# Patient Record
Sex: Female | Born: 1955 | Race: White | Hispanic: No | Marital: Married | Smoking: Current every day smoker
Health system: Southern US, Community
[De-identification: ages and names within clinical notes are randomized; demographics above are authoritative.]

## PROBLEM LIST (undated history)

## (undated) DIAGNOSIS — Z8614 Personal history of Methicillin resistant Staphylococcus aureus infection: Secondary | ICD-10-CM

## (undated) DIAGNOSIS — E785 Hyperlipidemia, unspecified: Secondary | ICD-10-CM

## (undated) DIAGNOSIS — G5600 Carpal tunnel syndrome, unspecified upper limb: Secondary | ICD-10-CM

## (undated) DIAGNOSIS — E559 Vitamin D deficiency, unspecified: Secondary | ICD-10-CM

## (undated) DIAGNOSIS — R7989 Other specified abnormal findings of blood chemistry: Secondary | ICD-10-CM

## (undated) DIAGNOSIS — D179 Benign lipomatous neoplasm, unspecified: Secondary | ICD-10-CM

## (undated) DIAGNOSIS — I10 Essential (primary) hypertension: Secondary | ICD-10-CM

## (undated) DIAGNOSIS — F419 Anxiety disorder, unspecified: Secondary | ICD-10-CM

## (undated) DIAGNOSIS — N200 Calculus of kidney: Secondary | ICD-10-CM

## (undated) DIAGNOSIS — D759 Disease of blood and blood-forming organs, unspecified: Secondary | ICD-10-CM

## (undated) DIAGNOSIS — E039 Hypothyroidism, unspecified: Secondary | ICD-10-CM

## (undated) DIAGNOSIS — M8580 Other specified disorders of bone density and structure, unspecified site: Secondary | ICD-10-CM

## (undated) HISTORY — DX: Calculus of kidney: N20.0

## (undated) HISTORY — DX: Anxiety disorder, unspecified: F41.9

## (undated) HISTORY — DX: Hypothyroidism, unspecified: E03.9

## (undated) HISTORY — DX: Other specified abnormal findings of blood chemistry: R79.89

## (undated) HISTORY — DX: Vitamin D deficiency, unspecified: E55.9

## (undated) HISTORY — DX: Hyperlipidemia, unspecified: E78.5

## (undated) HISTORY — DX: Benign lipomatous neoplasm, unspecified: D17.9

## (undated) HISTORY — DX: Other specified disorders of bone density and structure, unspecified site: M85.80

## (undated) HISTORY — DX: Essential (primary) hypertension: I10

---

## 1997-06-22 ENCOUNTER — Other Ambulatory Visit: Admission: RE | Admit: 1997-06-22 | Discharge: 1997-06-22 | Payer: Self-pay | Admitting: Gynecology

## 1997-12-05 ENCOUNTER — Ambulatory Visit (HOSPITAL_COMMUNITY): Admission: RE | Admit: 1997-12-05 | Discharge: 1997-12-05 | Payer: Self-pay | Admitting: Gynecology

## 1999-03-22 ENCOUNTER — Other Ambulatory Visit: Admission: RE | Admit: 1999-03-22 | Discharge: 1999-03-22 | Payer: Self-pay | Admitting: Gynecology

## 2000-12-29 ENCOUNTER — Other Ambulatory Visit: Admission: RE | Admit: 2000-12-29 | Discharge: 2000-12-29 | Payer: Self-pay | Admitting: Gynecology

## 2001-02-03 ENCOUNTER — Ambulatory Visit (HOSPITAL_COMMUNITY): Admission: RE | Admit: 2001-02-03 | Discharge: 2001-02-03 | Payer: Self-pay | Admitting: Internal Medicine

## 2001-02-03 ENCOUNTER — Encounter: Payer: Self-pay | Admitting: Internal Medicine

## 2003-08-31 ENCOUNTER — Encounter (INDEPENDENT_AMBULATORY_CARE_PROVIDER_SITE_OTHER): Payer: Self-pay | Admitting: *Deleted

## 2003-08-31 ENCOUNTER — Ambulatory Visit (HOSPITAL_COMMUNITY): Admission: RE | Admit: 2003-08-31 | Discharge: 2003-08-31 | Payer: Self-pay | Admitting: Surgery

## 2003-08-31 ENCOUNTER — Ambulatory Visit (HOSPITAL_BASED_OUTPATIENT_CLINIC_OR_DEPARTMENT_OTHER): Admission: RE | Admit: 2003-08-31 | Discharge: 2003-08-31 | Payer: Self-pay | Admitting: Surgery

## 2006-04-15 ENCOUNTER — Ambulatory Visit: Payer: Self-pay | Admitting: Internal Medicine

## 2007-03-05 HISTORY — PX: TOTAL ABDOMINAL HYSTERECTOMY W/ BILATERAL SALPINGOOPHORECTOMY: SHX83

## 2008-04-22 ENCOUNTER — Ambulatory Visit: Payer: Self-pay | Admitting: Internal Medicine

## 2008-04-27 ENCOUNTER — Ambulatory Visit: Payer: Self-pay | Admitting: Internal Medicine

## 2008-05-02 ENCOUNTER — Ambulatory Visit: Payer: Self-pay | Admitting: Gynecologic Oncology

## 2008-05-17 ENCOUNTER — Ambulatory Visit: Payer: Self-pay | Admitting: Gynecologic Oncology

## 2008-05-23 ENCOUNTER — Ambulatory Visit: Payer: Self-pay | Admitting: Internal Medicine

## 2008-06-01 ENCOUNTER — Ambulatory Visit: Payer: Self-pay | Admitting: Obstetrics and Gynecology

## 2008-06-02 ENCOUNTER — Ambulatory Visit: Payer: Self-pay | Admitting: Gynecologic Oncology

## 2008-06-10 ENCOUNTER — Inpatient Hospital Stay: Payer: Self-pay | Admitting: Obstetrics and Gynecology

## 2009-03-15 ENCOUNTER — Ambulatory Visit: Payer: Self-pay

## 2009-12-25 ENCOUNTER — Ambulatory Visit: Payer: Self-pay | Admitting: Internal Medicine

## 2010-04-03 NOTE — Assessment & Plan Note (Signed)
Summary: POSSIBLE BRONCHITIS/EVM   Vital Signs:  Patient Profile:   55 Years Old Female CC:      Cold & URI symptoms Height:     61.25 inches Weight:      158 pounds BMI:     29.72 O2 Sat:      97 % O2 treatment:    Room Air Temp:     98.4 degrees F oral Pulse rate:   104 / minute Pulse rhythm:   regular Resp:     20 per minute BP sitting:   163 / 101  (right arm)  Pt. in pain?   no  Vitals Entered By: Levonne Spiller EMT-P (December 25, 2009 4:43 PM)              Is Patient Diabetic? No Comments Pt is a smoker.1 pack per day.      Current Allergies: No known allergies History of Present Illness Chief Complaint: Cold & URI symptoms  REVIEW OF SYSTEMS Constitutional Symptoms      Denies fever, chills, night sweats, weight loss, weight gain, and fatigue.  Eyes       Denies change in vision, eye pain, eye discharge, glasses, contact lenses, and eye surgery. Ear/Nose/Throat/Mouth       Complains of sore throat and hoarseness.      Denies hearing loss/aids, change in hearing, ear pain, ear discharge, dizziness, frequent runny nose, frequent nose bleeds, sinus problems, and tooth pain or bleeding.  Respiratory       Complains of productive cough, wheezing, shortness of breath, and bronchitis.      Denies dry cough, asthma, and emphysema/COPD.      Comments: Colored Productive cough Cardiovascular       Denies murmurs, chest pain, and tires easily with exhertion.    Gastrointestinal       Denies stomach pain, nausea/vomiting, diarrhea, constipation, blood in bowel movements, and indigestion. Genitourniary       Denies painful urination, kidney stones, and loss of urinary control. Neurological       Denies paralysis, seizures, and fainting/blackouts. Musculoskeletal       Denies muscle pain, joint pain, joint stiffness, decreased range of motion, redness, swelling, muscle weakness, and gout.  Skin       Denies bruising, unusual mles/lumps or sores, and hair/skin or nail  changes.  Psych       Denies mood changes, temper/anger issues, anxiety/stress, speech problems, depression, and sleep problems.  Plan New Medications/Changes: HYCODAN 5 cc by mouth q 6 h as needed cough  #4 oz x 0, 12/25/2009, J. Juline Patch MD   The patient and/or caregiver has been counseled thoroughly with regard to medications prescribed including dosage, schedule, interactions, rationale for use, and possible side effects and they verbalize understanding.  Diagnoses and expected course of recovery discussed and will return if not improved as expected or if the condition worsens. Patient and/or caregiver verbalized understanding.  Prescriptions: HYCODAN 5 cc by mouth q 6 h as needed cough  #4 oz x 0   Entered and Authorized by:   J. Juline Patch MD   Signed by:   Shela Commons. Juline Patch MD on 12/25/2009   Method used:   Print then Give to Patient   RxID:   [id number]   Appended Document: POSSIBLE BRONCHITIS/EVM This office visit is done as an append so that the patient and staff didn't have to wait for a narcotic prescription to be printed.  cc: cough for 2-3 weeks that got  worse in last 24 hrs. Is yellow productive, mild to paroxysmal, and associated with wheezing, DOE,  patient denies chest pain, fever/chills, sob at rest. patient tried corriciden for hypertensives and it hasn't helped. patient smokes 1 ppd but none in last 3 days. tried an old zyban rx which was prescribed before starting paxil. reports everyone at her office with similar and those seeing doctors have reponded to antibiotics. on ros: slept OK and no change in activity level.  physical exam.  wf nad ox4 no cough during exam. eyes without redness. tms clear nasal clear throat nl. no pnd nodes. none a/c chest full excursion with mild wheezing left, mod on right, worse at bases posteriorly. no dullness. cor rrr no murmur extrem. no cynaosis clubbing edema neuro, cn intact, moves extreme well, nl  gait  dx. asthmatic bronchitis  plan: z pak, ventolin mdh 2 puffs q 4-8 as needed, hycodan syrup 5 cc q 6h as needed cough, reduce dose/freq of corriciden, stop zyban and go to patches until it can be discussed with pcp. continue to abstain from smoking. blood pressure check here tomorrow. fluids

## 2010-07-20 NOTE — Op Note (Signed)
NAME:  Mariah Flores, Mariah Flores                          ACCOUNT NO.:  [id number]   MEDICAL RECORD NO.:  [id number]                   PATIENT TYPE:  AMB   LOCATION:  [facility]                                  FACILITY:  [facility]   PHYSICIAN:  Currie Paris, M.D.           DATE OF BIRTH:  08-25-1955   DATE OF PROCEDURE:  08/31/2003  DATE OF DISCHARGE:                                 OPERATIVE REPORT   [facility] [phone].   PREOPERATIVE DIAGNOSIS:  Left supraclavicular mass.   POSTOPERATIVE DIAGNOSIS:  Left supraclavicular mass, lipoma.   OPERATION:  Excision of left supraclavicular mass.   SURGEON:  Currie Paris, M.D.   ANESTHESIA:  MAC.   CLINICAL HISTORY:  This patient is a 55 year old with a soft but persistent  left supraclavicular mass.  I was concerned this might represent some  adenopathy and after discussion with the patient, we elected to excise it.   DESCRIPTION OF PROCEDURE:  The patient was seen in the holding area, had no  further questions.  The mass was marked by palpation and was readily  palpable in the left supraclavicular area, about a fingerbreadth and a half  above the clavicular edge.   She was taken to the operating room and given IV sedation and was prepped  and draped.  I anesthetized with a combination of 1% Xylocaine plain and  0.5% Marcaine mixed equally.  I made a short incision directly over the mass  and it proved to be a well-circumscribed lipoma, which was sharply dissected  out and excised.  There was no other palpable mass underneath this or around  the area.   Everything appeared to be dry, so the incision was closed with a single 3-0  Vicryl subcutaneously, followed by 4-0 Monocryl subcuticular and Dermabond.   The patient tolerated the procedure well.  There were no complications, and  all counts were correct.                                               Currie Paris, M.D.   CJS/MEDQ  D:  08/31/2003  T:  08/31/2003  Job:  [id number]   cc:    Gabriel Earing, M.D.  [address]  [place]  [place] [address]  Fax: [phone]

## 2010-10-23 ENCOUNTER — Ambulatory Visit: Payer: Self-pay | Admitting: Obstetrics and Gynecology

## 2011-02-20 ENCOUNTER — Ambulatory Visit: Payer: Self-pay | Admitting: Women's Health

## 2015-02-20 ENCOUNTER — Other Ambulatory Visit: Payer: Self-pay | Admitting: Internal Medicine

## 2015-02-20 ENCOUNTER — Inpatient Hospital Stay: Payer: 59 | Attending: Internal Medicine | Admitting: Internal Medicine

## 2015-02-20 ENCOUNTER — Encounter: Payer: Self-pay | Admitting: *Deleted

## 2015-02-20 ENCOUNTER — Other Ambulatory Visit: Payer: Self-pay | Admitting: *Deleted

## 2015-02-20 ENCOUNTER — Inpatient Hospital Stay: Payer: 59

## 2015-02-20 VITALS — BP 165/101 | HR 85 | Temp 98.1°F | Resp 18 | Ht 63.0 in | Wt 156.5 lb

## 2015-02-20 DIAGNOSIS — F17210 Nicotine dependence, cigarettes, uncomplicated: Secondary | ICD-10-CM

## 2015-02-20 DIAGNOSIS — Z79899 Other long term (current) drug therapy: Secondary | ICD-10-CM | POA: Diagnosis not present

## 2015-02-20 DIAGNOSIS — E785 Hyperlipidemia, unspecified: Secondary | ICD-10-CM | POA: Diagnosis not present

## 2015-02-20 DIAGNOSIS — D75839 Thrombocytosis, unspecified: Secondary | ICD-10-CM

## 2015-02-20 DIAGNOSIS — D473 Essential (hemorrhagic) thrombocythemia: Secondary | ICD-10-CM

## 2015-02-20 DIAGNOSIS — E559 Vitamin D deficiency, unspecified: Secondary | ICD-10-CM | POA: Diagnosis not present

## 2015-02-20 DIAGNOSIS — E039 Hypothyroidism, unspecified: Secondary | ICD-10-CM | POA: Insufficient documentation

## 2015-02-20 DIAGNOSIS — M8580 Other specified disorders of bone density and structure, unspecified site: Secondary | ICD-10-CM | POA: Diagnosis not present

## 2015-02-20 DIAGNOSIS — Z87442 Personal history of urinary calculi: Secondary | ICD-10-CM | POA: Insufficient documentation

## 2015-02-20 LAB — CBC WITH DIFFERENTIAL/PLATELET
BASOS PCT: 1 %
Basophils Absolute: 0.1 10*3/uL (ref 0–0.1)
Eosinophils Absolute: 0.4 10*3/uL (ref 0–0.7)
Eosinophils Relative: 5 %
HEMATOCRIT: 40.5 % (ref 35.0–47.0)
HEMOGLOBIN: 13.7 g/dL (ref 12.0–16.0)
LYMPHS ABS: 1.9 10*3/uL (ref 1.0–3.6)
Lymphocytes Relative: 25 %
MCH: 27.2 pg (ref 26.0–34.0)
MCHC: 33.8 g/dL (ref 32.0–36.0)
MCV: 80.4 fL (ref 80.0–100.0)
MONOS PCT: 7 %
Monocytes Absolute: 0.6 10*3/uL (ref 0.2–0.9)
NEUTROS ABS: 5 10*3/uL (ref 1.4–6.5)
NEUTROS PCT: 62 %
Platelets: 479 10*3/uL — ABNORMAL HIGH (ref 150–440)
RBC: 5.04 MIL/uL (ref 3.80–5.20)
RDW: 14.7 % — ABNORMAL HIGH (ref 11.5–14.5)
WBC: 7.9 10*3/uL (ref 3.6–11.0)

## 2015-02-20 LAB — IRON AND TIBC
Iron: 35 ug/dL (ref 28–170)
SATURATION RATIOS: 10 % — AB (ref 10.4–31.8)
TIBC: 355 ug/dL (ref 250–450)
UIBC: 321 ug/dL

## 2015-02-20 NOTE — Progress Notes (Signed)
[facility] CONSULT NOTE  Patient Care Team: No Pcp Per Patient as PCP - General (General Practice)  CHIEF COMPLAINTS/PURPOSE OF CONSULTATION: Thrombocytosis  # HEMATOLOGIC HISTORY:  # 2016 FEB- THROMBOCYTOSIS- [id number]; N-Hb/white count  # Smoking-   HISTORY OF PRESENTING ILLNESS:  Mariah Flores 59 y.o.  female has been referred to [place] for elevated platelet counts.  Patient was first noted to have elevated platelet counts around  470 in the February 2016;  In March 2016- 522;  Repeat labs in October 2016 498 [ normal 380];  CBC otherwise within normal limits/ CMP within normal limits.    Patient denies any unusual weight loss denies any loss of appetite night sweats or nausea vomiting. No chills.  Denies any burning pain in fingertips or toes.   no chest pain no cough.  No unusual headaches.  ROS: A complete 10 point review of system is done which is negative except mentioned above in history of present illness  MEDICAL HISTORY:  Past Medical History  Diagnosis Date  . Elevated platelet count ([place])   . Hyperlipidemia   . Hypothyroidism   . Osteopenia   . Vitamin D deficiency   . Lipoma   . Kidney stones     15 yrs ago    SURGICAL HISTORY: Past Surgical History  Procedure Laterality Date  . Total abdominal hysterectomy w/ bilateral salpingoophorectomy  2009    SOCIAL HISTORY: Social History   Social History  . Marital Status: Single    Spouse Name: N/A  . Number of Children: N/A  . Years of Education: N/A   Occupational History  . Not on file.   Social History Main Topics  . Smoking status: Current Every Day Smoker -- 1.00 packs/day for 40 years    Types: Cigarettes  . Smokeless tobacco: Never Used  . Alcohol Use: No  . Drug Use: No  . Sexual Activity: Not on file   Other Topics Concern  . Not on file   Social History Narrative  . No narrative on file    FAMILY HISTORY: Family History  Problem Relation Age of Onset  . Kidney cancer  Father 60  . Heart disease Father   . Heart disease Mother     ALLERGIES:  has No Known Allergies.  MEDICATIONS:  Current Outpatient Prescriptions  Medication Sig Dispense Refill  . benzonatate (TESSALON) 200 MG capsule Take 1 capsule by mouth 2 (two) times daily as needed. cough    . hydrochlorothiazide (HYDRODIURIL) 25 MG tablet Take 1 tablet by mouth daily.    Marland Kitchen ibandronate (BONIVA) 150 MG tablet Take 1 tablet by mouth daily.    Marland Kitchen levothyroxine (SYNTHROID, LEVOTHROID) 88 MCG tablet Take 1 tablet by mouth daily.    Marland Kitchen PARoxetine (PAXIL) 40 MG tablet Take 1 tablet by mouth daily.    . simvastatin (ZOCOR) 20 MG tablet Take 1 tablet by mouth daily.    Marland Kitchen telmisartan (MICARDIS) 40 MG tablet Take 1 tablet by mouth daily.    . Vitamin D, Ergocalciferol, (DRISDOL) 50000 UNITS CAPS capsule Take 1 capsule by mouth once a week.     No current facility-administered medications for this visit.      Marland Kitchen  PHYSICAL EXAMINATION: ECOG PERFORMANCE STATUS: 0 - Asymptomatic  Filed Vitals:   02/20/15 1133  BP: 165/101  Pulse: 85  Temp: 98.1 F (36.7 C)  Resp: 18   Filed Weights   02/20/15 1121  Weight: 156 lb 8.4 oz (71 kg)  GENERAL: Well-nourished well-developed; Alert, no distress and comfortable.   Alone. EYES: no pallor or icterus OROPHARYNX: no thrush or ulceration; good dentition  NECK: supple, no masses felt LYMPH:  no palpable lymphadenopathy in the cervical, axillary or inguinal regions LUNGS: clear to auscultation and  No wheeze or crackles HEART/CVS: regular rate & rhythm and no murmurs; No lower extremity edema ABDOMEN: abdomen soft, non-tender and normal bowel sounds Musculoskeletal:no cyanosis of digits and no clubbing  PSYCH: alert & oriented x 3 with fluent speech NEURO: no focal motor/sensory deficits SKIN:  no rashes or significant lesions  LABORATORY DATA:  I have reviewed the data as listed No results found for: WBC, HGB, HCT, MCV, PLT No results for input(s):  NA, K, CL, CO2, GLUCOSE, BUN, CREATININE, CALCIUM, GFRNONAA, GFRAA, PROT, ALBUMIN, AST, ALT, ALKPHOS, BILITOT, BILIDIR, IBILI in the last 8760 hours.  RADIOGRAPHIC STUDIES: I have personally reviewed the radiological images as listed and agreed with the findings in the report. No results found.  ASSESSMENT & PLAN:   # Thrombocytosis- platelet count 490-522 [since February 2016]; normal white count and hemoglobin. Incidental. The etiology is unclear- today to rule out myeloproliferative neoplasm like essential thrombocytosis  Versus secondary causes. check jack 2/ also check BCR ABL to rule out CML. Check iron studies.   #  Smoker-  Will counsel regarding quitting smoking/ CT screening at next visit.   the above plan of care was discussed with the patient in detail. All questions were answered.  Patient follow-up with me in approximately 3-4 weeks with above lab work.  Thank you Dr.Knowles for allowing me to participate in the care of your pleasant patient. Please do not hesitate to contact me with questions or concerns in the interim.    Cammie Sickle, MD 02/20/2015 11:54 AM

## 2015-02-28 LAB — JAK2 GENOTYPR

## 2015-02-28 LAB — BCR-ABL1 FISH
Cells Analyzed: 200
Cells Counted: 200

## 2015-02-28 LAB — MPL MUTATION ANALYSIS

## 2015-03-08 ENCOUNTER — Encounter: Payer: Self-pay | Admitting: Surgery

## 2015-03-08 ENCOUNTER — Ambulatory Visit (INDEPENDENT_AMBULATORY_CARE_PROVIDER_SITE_OTHER): Payer: 59 | Admitting: Surgery

## 2015-03-08 ENCOUNTER — Ambulatory Visit
Admission: RE | Admit: 2015-03-08 | Discharge: 2015-03-08 | Disposition: A | Discharge status: Home / Self Care | Payer: 59 | Source: Ambulatory Visit | Attending: Surgery | Admitting: Surgery

## 2015-03-08 VITALS — BP 164/97 | HR 96 | Temp 98.3°F | Ht 63.0 in | Wt 155.8 lb

## 2015-03-08 DIAGNOSIS — E039 Hypothyroidism, unspecified: Secondary | ICD-10-CM | POA: Insufficient documentation

## 2015-03-08 DIAGNOSIS — L720 Epidermal cyst: Secondary | ICD-10-CM | POA: Insufficient documentation

## 2015-03-08 DIAGNOSIS — D179 Benign lipomatous neoplasm, unspecified: Secondary | ICD-10-CM | POA: Diagnosis not present

## 2015-03-08 DIAGNOSIS — R9431 Abnormal electrocardiogram [ECG] [EKG]: Secondary | ICD-10-CM | POA: Diagnosis not present

## 2015-03-08 DIAGNOSIS — M8580 Other specified disorders of bone density and structure, unspecified site: Secondary | ICD-10-CM | POA: Insufficient documentation

## 2015-03-08 DIAGNOSIS — I10 Essential (primary) hypertension: Secondary | ICD-10-CM | POA: Insufficient documentation

## 2015-03-08 NOTE — Patient Instructions (Addendum)
We will plan on taking these two cysts off in the Operating Room on 03/20/15.  Please hold your Aspirin for 7 days prior to your procedure. (03/12/14-03/20/15)  Please refer to your pre-care (blue) information sheet.

## 2015-03-08 NOTE — Progress Notes (Signed)
Subjective:     Patient ID: Mariah Flores, female   DOB: [birth year], 60 y.o.   MRN: [id number]  HPI 60 year old female who had previously lipoma removed from left supraclavicular area and now has return.  Patient states she started noticing it coming back about 2 years ago, initial operation was in 2005, lipoma.  Patient also says she has had a similar place under her chin.  She denies any pain, redness or drainage in the area.  She denies any fever, chills, sob, chest pain, abdominal pain or dysuria.   Past Medical History  Diagnosis Date  . Elevated platelet count ([place])   . Hyperlipidemia   . Hypothyroidism   . Osteopenia   . Vitamin D deficiency   . Lipoma   . Kidney stones     15 yrs ago  . Hypertension   . Anxiety    Past Surgical History  Procedure Laterality Date  . Total abdominal hysterectomy w/ bilateral salpingoophorectomy  2009   Family History  Problem Relation Age of Onset  . Kidney cancer Father 69  . Heart disease Father   . Cancer Father     Kidney  . Hyperlipidemia Father   . Heart disease Mother   . Hyperlipidemia Mother    Social History   Social History  . Marital Status: Single    Spouse Name: N/A  . Number of Children: N/A  . Years of Education: N/A   Social History Main Topics  . Smoking status: Current Every Day Smoker -- 1.00 packs/day for 40 years    Types: Cigarettes  . Smokeless tobacco: Never Used  . Alcohol Use: No  . Drug Use: No  . Sexual Activity: Not Asked   Other Topics Concern  . None   Social History Narrative    Current outpatient prescriptions:  .  aspirin 81 MG tablet, Take 81 mg by mouth daily., Disp: , Rfl:  .  hydrochlorothiazide (HYDRODIURIL) 25 MG tablet, Take 1 tablet by mouth daily., Disp: , Rfl:  .  ibandronate (BONIVA) 150 MG tablet, Take 1 tablet by mouth daily., Disp: , Rfl:  .  levothyroxine (SYNTHROID, LEVOTHROID) 88 MCG tablet, Take 1 tablet by mouth daily., Disp: , Rfl:  .  PARoxetine (PAXIL) 40 MG  tablet, Take 1 tablet by mouth daily., Disp: , Rfl:  .  simvastatin (ZOCOR) 20 MG tablet, Take 1 tablet by mouth daily., Disp: , Rfl:  .  telmisartan (MICARDIS) 40 MG tablet, Take 1 tablet by mouth daily., Disp: , Rfl:  .  Vitamin D, Ergocalciferol, (DRISDOL) 50000 UNITS CAPS capsule, Take 1 capsule by mouth once a week., Disp: , Rfl:  No Known Allergies   Review of Systems  Constitutional: Negative for fever, chills, activity change, appetite change and fatigue.  HENT: Negative for congestion and sore throat.   Respiratory: Negative for chest tightness, shortness of breath and wheezing.   Cardiovascular: Negative for chest pain, palpitations and leg swelling.  Gastrointestinal: Negative for nausea, vomiting, abdominal pain, diarrhea and constipation.  Genitourinary: Negative for dysuria and hematuria.  Musculoskeletal: Negative for joint swelling, gait problem and neck stiffness.  Skin: Negative for color change, pallor, rash and wound.       Areas as mentioned in HPI   Neurological: Negative for dizziness and tremors.  Hematological: Negative for adenopathy. Does not bruise/bleed easily.  Psychiatric/Behavioral: Negative for agitation. The patient is not nervous/anxious.   All other systems reviewed and are negative.  Objective:   Physical Exam  Constitutional: She is oriented to person, place, and time. She appears well-developed and well-nourished. No distress.  HENT:  Head: Normocephalic and atraumatic.  Right Ear: External ear normal.  Left Ear: External ear normal.  Nose: Nose normal.  Mouth/Throat: Oropharynx is clear and moist. No oropharyngeal exudate.  Eyes: Conjunctivae and EOM are normal. Pupils are equal, round, and reactive to light. No scleral icterus.  Neck: Normal range of motion. Neck supple. No tracheal deviation present. No thyromegaly present.  Cardiovascular: Normal rate, regular rhythm, normal heart sounds and intact distal pulses.  Exam reveals no  gallop and no friction rub.   No murmur heard. Pulmonary/Chest: Effort normal and breath sounds normal. No respiratory distress. She has no wheezes.  Abdominal: Soft. Bowel sounds are normal. She exhibits no distension. There is no tenderness.  Musculoskeletal: Normal range of motion. She exhibits no edema or tenderness.  Lymphadenopathy:    She has no cervical adenopathy.  Neurological: She is alert and oriented to person, place, and time. No cranial nerve deficit.  Skin: Skin is warm and dry. No rash noted. No erythema. No pallor.  1cm nodule in submental area with punctum, no erythema or drainage, appear to be EIC  1cm nodule in most inferior portion of left supraclavicular scar, ? If second EIC or suture granuloma  Left forearm <24mm area of stuck on, flaky appearing skin with regular boarders, appear to be Acitinic  keratosis   Psychiatric: She has a normal mood and affect. Her behavior is normal. Judgment and thought content normal.       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  Patient expressed understanding and would like have these removed but due to anxiety would like them done in the OR.  Will schedule her for excision removal x 2.

## 2015-03-09 ENCOUNTER — Telehealth: Payer: Self-pay | Admitting: Surgery

## 2015-03-09 NOTE — Telephone Encounter (Signed)
Pt advised of pre op date/time and sx date. Sx: 03/20/15 with Dr Loflin--Excision of left supraclavicular and submental skin lesion.  Pre op: 03/13/15 @ 10:00 am--Office.

## 2015-03-13 ENCOUNTER — Inpatient Hospital Stay: Admission: RE | Admit: 2015-03-13 | Payer: 59 | Source: Ambulatory Visit

## 2015-03-14 ENCOUNTER — Telehealth: Payer: Self-pay | Admitting: Internal Medicine

## 2015-03-14 NOTE — Telephone Encounter (Signed)
Neg-bcr-abl & jak-2 & mpl

## 2015-03-15 ENCOUNTER — Inpatient Hospital Stay: Admission: RE | Admit: 2015-03-15 | Payer: 59 | Source: Ambulatory Visit

## 2015-03-16 ENCOUNTER — Encounter: Payer: Self-pay | Admitting: *Deleted

## 2015-03-16 NOTE — Patient Instructions (Signed)
  Your procedure is scheduled on: 03-20-15 [facility]) Report to [place] To find out your arrival time please call [phone] between 1PM - 3PM on 03-17-15 (FRIDAY)  Remember: Instructions that are not followed completely may result in serious medical risk, up to and including death, or upon the discretion of your surgeon and anesthesiologist your surgery may need to be rescheduled.    _X___ 1. Do not eat food or drink liquids after midnight. No gum chewing or hard candies.     _X___ 2. No Alcohol for 24 hours before or after surgery.   ____ 3. Bring all medications with you on the day of surgery if instructed.    _X___ 4. Notify your doctor if there is any change in your medical condition     (cold, fever, infections).     Do not wear jewelry, make-up, hairpins, clips or nail polish.  Do not wear lotions, powders, or perfumes. You may wear deodorant.  Do not shave 48 hours prior to surgery. Men may shave face and neck.  Do not bring valuables to the hospital.    [facility] is not responsible for any belongings or valuables.               Contacts, dentures or bridgework may not be worn into surgery.  Leave your suitcase in the car. After surgery it may be brought to your room.  For patients admitted to the hospital, discharge time is determined by your treatment team.   Patients discharged the day of surgery will not be allowed to drive home.   Please read over the following fact sheets that you were given:     _X___ Take these medicines the morning of surgery with A SIP OF WATER:    1. LEVOTHYROXINE (SYNTHROID)  2. PAXIL (PAROXETINE)  3. MICARDIS  4. PRILOSEC  5. TAKE A PRILOSEC ON Sunday NIGHT  6.  ____ Fleet Enema (as directed)   _X___ Use CHG Soap as directed  ____ Use inhalers on the day of surgery  ____ Stop metformin 2 days prior to surgery    ____ Take 1/2 of usual insulin dose the night before surgery and none on the morning of  surgery.   ____ Stop Coumadin/Plavix/aspirin-CHECK WITH DR B. OR DR. Azalee Course ABOUT ASPIRIN   ____ Stop Anti-inflammatories-NO NSAIDS OR ASPIRIN PRODUCTS-TYLENOL OK TO TAKE   ____ Stop supplements until after surgery.    ____ Bring C-Pap to the hospital.

## 2015-03-17 ENCOUNTER — Encounter
Admission: RE | Admit: 2015-03-17 | Discharge: 2015-03-17 | Disposition: A | Discharge status: Home / Self Care | Payer: 59 | Source: Ambulatory Visit | Attending: Surgery | Admitting: Surgery

## 2015-03-17 ENCOUNTER — Telehealth: Payer: Self-pay | Admitting: *Deleted

## 2015-03-17 ENCOUNTER — Telehealth: Payer: Self-pay | Admitting: Surgery

## 2015-03-17 DIAGNOSIS — Z01812 Encounter for preprocedural laboratory examination: Secondary | ICD-10-CM | POA: Insufficient documentation

## 2015-03-17 LAB — SURGICAL PCR SCREEN
MRSA, PCR: NEGATIVE
STAPHYLOCOCCUS AUREUS: POSITIVE — AB

## 2015-03-17 LAB — POTASSIUM: POTASSIUM: 3.3 mmol/L — AB (ref 3.5–5.1)

## 2015-03-17 NOTE — Pre-Procedure Instructions (Signed)
DR Ronelle Nigh NOTIFIED OF ABNORMAL EKG THAT WAS ORDERED BY DR LOFLIN ON 03-08-15 AND THE ABNORMAL EKG WAS NEVER ADRESSED UNTIL SEEN BY ME IN PAT TODAY.  PT HAS NO PCP AND DR KEPHART WANTS HER TO GET CARDIAC CLEARANCE DUE TO T WAVE INVERSION THAT IS NOW EVIDENT.  CALLED ANGIE AT DR LOFLINS OFFICE AND ADDRESSED THE NEED FOR CLEARANCE-ANGIE AWARE SHE IS GOING TO HAVE TO CANCEL PTS SURGERY UNTIL CLEARED.FAXED CLEARANCE NOTE AND EKG TO ANGIE

## 2015-03-17 NOTE — Telephone Encounter (Signed)
I have called patient and advised her that her surgery has been canceled on 03/20/15 with Dr Azalee Course due to anesthesia requiring cardiac clearance. I informed her that once cardiac clearance was obtained that we could then reschedule her surgery. She will not need another pre op appointment.   She has been informed that her cardiology appointment is with Dr Fletcher Anon on 03/21/15 @ 1:30 pm.  I have faxed a Clearance form to Dr Tyrell Antonio office @ [phone].

## 2015-03-17 NOTE — Telephone Encounter (Signed)
Pt informed. She states that her surgery for next Monday was just cnl today due to the need for cardiac clearance. I explained that her condition is high plts, which does not put her a risk for bleeding. Teach back process performed.

## 2015-03-17 NOTE — Telephone Encounter (Signed)
Pt scheduled to have a cysts removed from her neck area. Calling to know if she needs her plt count check before this surgery date. Patient sees md for thrombocytosis.

## 2015-03-17 NOTE — Telephone Encounter (Signed)
She does not need platelet check prior to cyst excision. Please inform pt

## 2015-03-17 NOTE — Telephone Encounter (Signed)
Messaged left with Heather in Pre-admit to let her know status of clearance. Will call her back when more information is available from cardiologist after appointment. Also informed her that Surgery has been cancelled for 03/20/15.

## 2015-03-20 ENCOUNTER — Ambulatory Visit: Admission: RE | Admit: 2015-03-20 | Payer: 59 | Source: Ambulatory Visit | Admitting: Surgery

## 2015-03-20 HISTORY — DX: Disease of blood and blood-forming organs, unspecified: D75.9

## 2015-03-20 HISTORY — DX: Personal history of Methicillin resistant Staphylococcus aureus infection: Z86.14

## 2015-03-20 SURGERY — EXCISION, CYST, NECK
Anesthesia: Choice

## 2015-03-21 ENCOUNTER — Encounter: Payer: Self-pay | Admitting: Cardiovascular Disease

## 2015-03-21 ENCOUNTER — Ambulatory Visit (INDEPENDENT_AMBULATORY_CARE_PROVIDER_SITE_OTHER): Payer: 59 | Admitting: Cardiovascular Disease

## 2015-03-21 VITALS — BP 140/94 | HR 95 | Ht 63.0 in | Wt 156.0 lb

## 2015-03-21 DIAGNOSIS — Z01818 Encounter for other preprocedural examination: Secondary | ICD-10-CM

## 2015-03-21 DIAGNOSIS — Z01810 Encounter for preprocedural cardiovascular examination: Secondary | ICD-10-CM | POA: Diagnosis not present

## 2015-03-21 DIAGNOSIS — R0609 Other forms of dyspnea: Secondary | ICD-10-CM | POA: Diagnosis not present

## 2015-03-21 DIAGNOSIS — R0602 Shortness of breath: Secondary | ICD-10-CM | POA: Diagnosis not present

## 2015-03-21 DIAGNOSIS — Z720 Tobacco use: Secondary | ICD-10-CM

## 2015-03-21 NOTE — Progress Notes (Signed)
Primary care physician: Dr. Georga Bora  HPI  This is a pleasant 60 year old female who was referred for preoperative cardiovascular evaluation due to an abnormal EKG. She has left neck cyst which requires surgical removal. This is a benign cyst. She has no previous cardiac history but has multiple risk factors for coronary artery disease including hypertension of at least 15 years of duration, hyperlipidemia and tobacco use. She also has strong family history of coronary artery disease in almost all family members shortly after the age of 78. Her mother had CABG at the age of 4. Her grandfather died at the age of 72 myocardial infarction in grandmother had CABG. The patient smokes one pack per day but she is not aware of COPD. She was found to have an abnormal EKG with lateral T-wave inversions with some minor ST depression. She denies any chest pain or discomfort. However, she has noticed progressive exertional dyspnea over the last few months. This is currently happening even walking to the mailbox. She also had some insomnia recently. No palpitations, dizziness or syncope. No orthopnea or PND.  No Known Allergies   Current Outpatient Prescriptions on File Prior to Visit  Medication Sig Dispense Refill  . aspirin 81 MG tablet Take 81 mg by mouth daily.    . hydrochlorothiazide (HYDRODIURIL) 25 MG tablet Take 1 tablet by mouth daily.    Marland Kitchen ibandronate (BONIVA) 150 MG tablet Take 1 tablet by mouth every 30 (thirty) days.     Marland Kitchen levothyroxine (SYNTHROID, LEVOTHROID) 88 MCG tablet Take 1 tablet by mouth daily.    Marland Kitchen omeprazole (PRILOSEC) 20 MG capsule Take 20 mg by mouth as needed.    Marland Kitchen PARoxetine (PAXIL) 40 MG tablet Take 1 tablet by mouth every morning.     . simvastatin (ZOCOR) 20 MG tablet Take 1 tablet by mouth daily.    Marland Kitchen telmisartan (MICARDIS) 40 MG tablet Take 1 tablet by mouth every morning.      No current facility-administered medications on file prior to visit.     Past Medical  History  Diagnosis Date  . Elevated platelet count ([place])   . Hyperlipidemia   . Hypothyroidism   . Osteopenia   . Vitamin D deficiency   . Lipoma   . Kidney stones     15 yrs ago  . Hypertension   . Anxiety   . Blood dyscrasia   . History of MRSA infection      Past Surgical History  Procedure Laterality Date  . Total abdominal hysterectomy w/ bilateral salpingoophorectomy  2009     Family History  Problem Relation Age of Onset  . Kidney cancer Father 22  . Heart disease Father   . Cancer Father     Kidney  . Hyperlipidemia Father   . Heart disease Mother   . Hyperlipidemia Mother      Social History   Social History  . Marital Status: Married    Spouse Name: N/A  . Number of Children: N/A  . Years of Education: N/A   Occupational History  . Not on file.   Social History Main Topics  . Smoking status: Current Every Day Smoker -- 1.00 packs/day for 40 years    Types: Cigarettes  . Smokeless tobacco: Never Used  . Alcohol Use: No  . Drug Use: No  . Sexual Activity: Not on file   Other Topics Concern  . Not on file   Social History Narrative     ROS A 10 point review  of system was performed. It is negative other than that mentioned in the history of present illness.   PHYSICAL EXAM   BP 140/94 mmHg  Pulse 95  Ht 5\' 3"  (1.6 m)  Wt 156 lb (70.761 kg)  BMI 27.64 kg/m2 Constitutional: She is oriented to person, place, and time. She appears well-developed and well-nourished. No distress.  HENT: No nasal discharge.  Head: Normocephalic and atraumatic.  Eyes: Pupils are equal and round. No discharge.  Neck: Normal range of motion. Neck supple. No JVD present. No thyromegaly present.  Cardiovascular: Normal rate, regular rhythm, normal heart sounds. Exam reveals no gallop and no friction rub. No murmur heard.  Pulmonary/Chest: Effort normal and breath sounds normal. No stridor. No respiratory distress. She has no wheezes. She has no rales. She  exhibits no tenderness.  Abdominal: Soft. Bowel sounds are normal. She exhibits no distension. There is no tenderness. There is no rebound and no guarding.  Musculoskeletal: Normal range of motion. She exhibits no edema and no tenderness.  Neurological: She is alert and oriented to person, place, and time. Coordination normal.  Skin: Skin is warm and dry. No rash noted. She is not diaphoretic. No erythema. No pallor.  Psychiatric: She has a normal mood and affect. Her behavior is normal. Judgment and thought content normal.     EKG: Normal sinus rhythm with T-wave inversion in 1, V4 to V6 as well as the inferior leads. There is minor ST depression. No criteria for LVH.   ASSESSMENT AND PLAN

## 2015-03-21 NOTE — Assessment & Plan Note (Signed)
The patient has multiple risk factors for coronary artery disease. Symptoms include progressive exertional dyspnea over the last few months with an abnormal EKG suggestive of ischemia. Thus, I recommend evaluation with a treadmill nuclear stress test. Given the baseline abnormal EKG, treadmill stress test alone is not sufficient. If stress test comes back unremarkable, the patient can proceed with surgery at an overall low risk.

## 2015-03-21 NOTE — Telephone Encounter (Signed)
Patient was seen by Dr. Fletcher Anon today. Clearance cannot be given at this time as patient needs to have Stress test, this has been scheduled on 03/29/15 at 0715am for Stress test. Will check back with Dr. Fletcher Anon after this date regarding clearance.

## 2015-03-21 NOTE — Assessment & Plan Note (Signed)
I discussed with her the importance of smoking cessation and provided her with written instructions

## 2015-03-21 NOTE — Assessment & Plan Note (Signed)
She will be evaluated for ischemic heart disease as outlined above. Smoking is likely contributing to her symptoms. She does have a baseline abnormal EKG. If this is not due to ischemic heart disease, could be due to hypertensive heart disease although there is no LVH on the EKG.  I discussed with the patient the importance of lifestyle changes in order to decrease the chance of future coronary artery disease and cardiovascular events. We discussed the importance of controlling risk factors, healthy diet as well as regular exercise. I also explained to him that a normal stress test does not rule out atherosclerosis.

## 2015-03-21 NOTE — Patient Instructions (Addendum)
Medication Instructions:  Your physician recommends that you continue on your current medications as directed. Please refer to the Current Medication list given to you today.   Labwork: none  Testing/Procedures: Your physician has requested that you have a lexiscan myoview. For further information please visit [email or web address]. Please follow instruction sheet, as given.  [place]  Your caregiver has ordered a Stress Test with nuclear imaging. The purpose of this test is to evaluate the blood supply to your heart muscle. This procedure is referred to as a "Non-Invasive Stress Test." This is because other than having an IV started in your vein, nothing is inserted or "invades" your body. Cardiac stress tests are done to find areas of poor blood flow to the heart by determining the extent of coronary artery disease (CAD). Some patients exercise on a treadmill, which naturally increases the blood flow to your heart, while others who are  unable to walk on a treadmill due to physical limitations have a pharmacologic/chemical stress agent called Lexiscan . This medicine will mimic walking on a treadmill by temporarily increasing your coronary blood flow.   Please note: these test may take anywhere between 2-4 hours to complete  PLEASE REPORT TO [place] TO GO  Date of Procedure: Wednesday, Jan 25  Arrival Time for Procedure: 7:15am  Instructions regarding medication:    __xx__:  Hold other medications as follows: You may hold HCTZ the morning of your procedure and resume afterwards.  PLEASE NOTIFY THE OFFICE AT LEAST 25 HOURS IN ADVANCE IF YOU ARE UNABLE TO KEEP YOUR APPOINTMENT.  [phone] AND  PLEASE NOTIFY NUCLEAR MEDICINE AT [facility] AT LEAST 24 HOURS IN ADVANCE IF YOU ARE UNABLE TO KEEP YOUR APPOINTMENT. [phone]  How to prepare for your Myoview test:   Do not eat or drink after midnight  No  caffeine for 24 hours prior to test  No smoking 24 hours prior to test.  Your medication may be taken with water.  If your doctor stopped a medication because of this test, do not take that medication.  Ladies, please do not wear dresses.  Skirts or pants are appropriate. Please wear a short sleeve shirt.  No perfume, cologne or lotion.  Wear comfortable walking shoes. No heels!            Follow-Up: Your physician recommends that you schedule a follow-up appointment in: three months with Dr. Fletcher Anon.    Any Other Special Instructions Will Be Listed Below (If Applicable).     If you need a refill on your cardiac medications before your next appointment, please call your pharmacy.  Smoking Cessation, Tips for Success If you are ready to quit smoking, congratulations! You have chosen to help yourself be healthier. Cigarettes bring nicotine, tar, carbon monoxide, and other irritants into your body. Your lungs, heart, and blood vessels will be able to work better without these poisons. There are many different ways to quit smoking. Nicotine gum, nicotine patches, a nicotine inhaler, or nicotine nasal spray can help with physical craving. Hypnosis, support groups, and medicines help break the habit of smoking. WHAT THINGS CAN I DO TO MAKE QUITTING EASIER?  Here are some tips to help you quit for good:  Pick a date when you will quit smoking completely. Tell all of your friends and family about your plan to quit on that date.  Do not try to slowly cut down on the number of  cigarettes you are smoking. Pick a quit date and quit smoking completely starting on that day.  Throw away all cigarettes.   Clean and remove all ashtrays from your home, work, and car.  On a card, write down your reasons for quitting. Carry the card with you and read it when you get the urge to smoke.  Cleanse your body of nicotine. Drink enough water and fluids to keep your urine clear or pale yellow. Do  this after quitting to flush the nicotine from your body.  Learn to predict your moods. Do not let a bad situation be your excuse to have a cigarette. Some situations in your life might tempt you into wanting a cigarette.  Never have "just one" cigarette. It leads to wanting another and another. Remind yourself of your decision to quit.  Change habits associated with smoking. If you smoked while driving or when feeling stressed, try other activities to replace smoking. Stand up when drinking your coffee. Brush your teeth after eating. Sit in a different chair when you read the paper. Avoid alcohol while trying to quit, and try to drink fewer caffeinated beverages. Alcohol and caffeine may urge you to smoke.  Avoid foods and drinks that can trigger a desire to smoke, such as sugary or spicy foods and alcohol.  Ask people who smoke not to smoke around you.  Have something planned to do right after eating or having a cup of coffee. For example, plan to take a walk or exercise.  Try a relaxation exercise to calm you down and decrease your stress. Remember, you may be tense and nervous for the first 2 weeks after you quit, but this will pass.  Find new activities to keep your hands busy. Play with a pen, coin, or rubber band. Doodle or draw things on paper.  Brush your teeth right after eating. This will help cut down on the craving for the taste of tobacco after meals. You can also try mouthwash.   Use oral substitutes in place of cigarettes. Try using lemon drops, carrots, cinnamon sticks, or chewing gum. Keep them handy so they are available when you have the urge to smoke.  When you have the urge to smoke, try deep breathing.  Designate your home as a nonsmoking area.  If you are a heavy smoker, ask your health care provider about a prescription for nicotine chewing gum. It can ease your withdrawal from nicotine.  Reward yourself. Set aside the cigarette money you save and buy yourself  something nice.  Look for support from others. Join a support group or smoking cessation program. Ask someone at home or at work to help you with your plan to quit smoking.  Always ask yourself, "Do I need this cigarette or is this just a reflex?" Tell yourself, "Today, I choose not to smoke," or "I do not want to smoke." You are reminding yourself of your decision to quit.  Do not replace cigarette smoking with electronic cigarettes (commonly called e-cigarettes). The safety of e-cigarettes is unknown, and some may contain harmful chemicals.  If you relapse, do not give up! Plan ahead and think about what you will do the next time you get the urge to smoke. HOW WILL I FEEL WHEN I QUIT SMOKING? You may have symptoms of withdrawal because your body is used to nicotine (the addictive substance in cigarettes). You may crave cigarettes, be irritable, feel very hungry, cough often, get headaches, or have difficulty concentrating. The withdrawal symptoms are  only temporary. They are strongest when you first quit but will go away within 10-14 days. When withdrawal symptoms occur, stay in control. Think about your reasons for quitting. Remind yourself that these are signs that your body is healing and getting used to being without cigarettes. Remember that withdrawal symptoms are easier to treat than the major diseases that smoking can cause.  Even after the withdrawal is over, expect periodic urges to smoke. However, these cravings are generally short lived and will go away whether you smoke or not. Do not smoke! WHAT RESOURCES ARE AVAILABLE TO HELP ME QUIT SMOKING? Your health care provider can direct you to community resources or hospitals for support, which may include:  Group support.  Education.  Hypnosis.  Therapy.   This information is not intended to replace advice given to you by your health care provider. Make sure you discuss any questions you have with your health care provider.    Document Released: 11/17/2003 Document Revised: 03/11/2014 Document Reviewed: 08/06/2012 Elsevier Interactive Patient Education 2016 [place].   Cardiac Nuclear Scanning A cardiac nuclear scan is used to check your heart for problems, such as the following:  A portion of the heart is not getting enough blood.  Part of the heart muscle has died, which happens with a heart attack.  The heart wall is not working normally.  In this test, a radioactive dye (tracer) is injected into your bloodstream. After the tracer has traveled to your heart, a scanning device is used to measure how much of the tracer is absorbed by or distributed to various areas of your heart. LET [facility] CARE PROVIDER KNOW ABOUT:  Any allergies you have.  All medicines you are taking, including vitamins, herbs, eye drops, creams, and over-the-counter medicines.  Previous problems you or members of your family have had with the use of anesthetics.  Any blood disorders you have.  Previous surgeries you have had.  Medical conditions you have.  RISKS AND COMPLICATIONS Generally, this is a safe procedure. However, as with any procedure, problems can occur. Possible problems include:   Serious chest pain.  Rapid heartbeat.  Sensation of warmth in your chest. This usually passes quickly. BEFORE THE PROCEDURE Ask your health care provider about changing or stopping your regular medicines. PROCEDURE This procedure is usually done at a hospital and takes 2-4 hours.  An IV tube is inserted into one of your veins.  Your health care provider will inject a small amount of radioactive tracer through the tube.  You will then wait for 20-40 minutes while the tracer travels through your bloodstream.  You will lie down on an exam table so images of your heart can be taken. Images will be taken for about 15-20 minutes.  You will exercise on a treadmill or stationary bike. While you exercise, your heart activity  will be monitored with an electrocardiogram (ECG), and your blood pressure will be checked.  If you are unable to exercise, you may be given a medicine to make your heart beat faster.  When blood flow to your heart has peaked, tracer will again be injected through the IV tube.  After 20-40 minutes, you will get back on the exam table and have more images taken of your heart.  When the procedure is over, your IV tube will be removed. AFTER THE PROCEDURE  You will likely be able to leave shortly after the test. Unless your health care provider tells you otherwise, you may return to your  normal schedule, including diet, activities, and medicines.  Make sure you find out how and when you will get your test results.   This information is not intended to replace advice given to you by your health care provider. Make sure you discuss any questions you have with your health care provider.   Document Released: 03/15/2004 Document Revised: 02/23/2013 Document Reviewed: 01/27/2013 Elsevier Interactive Patient Education [employer].

## 2015-03-22 ENCOUNTER — Telehealth: Payer: Self-pay

## 2015-03-22 ENCOUNTER — Inpatient Hospital Stay: Payer: 59 | Attending: Internal Medicine | Admitting: Internal Medicine

## 2015-03-22 VITALS — Ht 63.0 in | Wt 154.3 lb

## 2015-03-22 DIAGNOSIS — Z7982 Long term (current) use of aspirin: Secondary | ICD-10-CM | POA: Diagnosis not present

## 2015-03-22 DIAGNOSIS — I10 Essential (primary) hypertension: Secondary | ICD-10-CM | POA: Insufficient documentation

## 2015-03-22 DIAGNOSIS — E785 Hyperlipidemia, unspecified: Secondary | ICD-10-CM | POA: Diagnosis not present

## 2015-03-22 DIAGNOSIS — F17210 Nicotine dependence, cigarettes, uncomplicated: Secondary | ICD-10-CM | POA: Diagnosis not present

## 2015-03-22 DIAGNOSIS — E039 Hypothyroidism, unspecified: Secondary | ICD-10-CM | POA: Insufficient documentation

## 2015-03-22 DIAGNOSIS — E559 Vitamin D deficiency, unspecified: Secondary | ICD-10-CM | POA: Diagnosis not present

## 2015-03-22 DIAGNOSIS — D473 Essential (hemorrhagic) thrombocythemia: Secondary | ICD-10-CM

## 2015-03-22 DIAGNOSIS — Z79899 Other long term (current) drug therapy: Secondary | ICD-10-CM

## 2015-03-22 DIAGNOSIS — Z87442 Personal history of urinary calculi: Secondary | ICD-10-CM | POA: Diagnosis not present

## 2015-03-22 DIAGNOSIS — D75839 Thrombocytosis, unspecified: Secondary | ICD-10-CM

## 2015-03-22 NOTE — Progress Notes (Signed)
[place] NOTE  Patient Care Team: Pcp Not In System as PCP - General Elgie Collard, MD as Referring Physician (Gynecology) Elgie Collard, MD as Referring Physician (Gynecology) Wellington Hampshire, MD as Consulting Physician (Cardiology)  CHIEF COMPLAINTS/PURPOSE OF CONSULTATION: Thrombocytosis  # HEMATOLOGIC HISTORY:  # 2016 FEB- THROMBOCYTOSIS- [id number]; N-Hb/white count; Neg- Jak-2/Bcr-abl/MPL  # Smoking-counseled to quit;  HISTORY OF PRESENTING ILLNESS:  Mariah Flores 60 y.o.  female is here today with any results of the workup that was ordered for her elevated platelets. Patient continues denies any unusual weight loss denies any loss of appetite night sweats or nausea vomiting. No chills.  Denies any burning pain in fingertips or toes. No chest pain no cough.  No unusual headaches.  ROS: A complete 10 point review of system is done which is negative except mentioned above in history of present illness  MEDICAL HISTORY:  Past Medical History  Diagnosis Date  . Elevated platelet count ([place])   . Hyperlipidemia   . Hypothyroidism   . Osteopenia   . Vitamin D deficiency   . Lipoma   . Kidney stones     15 yrs ago  . Hypertension   . Anxiety   . Blood dyscrasia   . History of MRSA infection     SURGICAL HISTORY: Past Surgical History  Procedure Laterality Date  . Total abdominal hysterectomy w/ bilateral salpingoophorectomy  2009    SOCIAL HISTORY: Social History   Social History  . Marital Status: Married    Spouse Name: N/A  . Number of Children: N/A  . Years of Education: N/A   Occupational History  . Not on file.   Social History Main Topics  . Smoking status: Current Every Day Smoker -- 1.00 packs/day for 40 years    Types: Cigarettes  . Smokeless tobacco: Never Used  . Alcohol Use: No  . Drug Use: No  . Sexual Activity: Not on file   Other Topics Concern  . Not on file   Social History Narrative    FAMILY  HISTORY: Family History  Problem Relation Age of Onset  . Kidney cancer Father 89  . Heart disease Father   . Cancer Father     Kidney  . Hyperlipidemia Father   . Heart disease Mother   . Hyperlipidemia Mother     ALLERGIES:  has No Known Allergies.  MEDICATIONS:  Current Outpatient Prescriptions  Medication Sig Dispense Refill  . aspirin 81 MG tablet Take 81 mg by mouth daily.    . hydrochlorothiazide (HYDRODIURIL) 25 MG tablet Take 1 tablet by mouth daily.    Marland Kitchen ibandronate (BONIVA) 150 MG tablet Take 1 tablet by mouth every 30 (thirty) days.     Marland Kitchen levothyroxine (SYNTHROID, LEVOTHROID) 88 MCG tablet Take 1 tablet by mouth daily.    Marland Kitchen omeprazole (PRILOSEC) 20 MG capsule Take 20 mg by mouth as needed.    Marland Kitchen PARoxetine (PAXIL) 40 MG tablet Take 1 tablet by mouth every morning.     . simvastatin (ZOCOR) 20 MG tablet Take 1 tablet by mouth daily.    Marland Kitchen telmisartan (MICARDIS) 40 MG tablet Take 1 tablet by mouth every morning.      No current facility-administered medications for this visit.      Marland Kitchen  PHYSICAL EXAMINATION: ECOG PERFORMANCE STATUS: 0 - Asymptomatic  There were no vitals filed for this visit. Filed Weights   03/22/15 1437  Weight: 154 lb 5.2 oz (70 kg)  GENERAL: Well-nourished well-developed; Alert, no distress and comfortable.   Alone.  LABORATORY DATA:  I have reviewed the data as listed Lab Results  Component Value Date   WBC 7.9 02/20/2015   HGB 13.7 02/20/2015   HCT 40.5 02/20/2015   MCV 80.4 02/20/2015   PLT 479* 02/20/2015    Recent Labs  03/17/15 1158  K 3.3*    RADIOGRAPHIC STUDIES: I have personally reviewed the radiological images as listed and agreed with the findings in the report. No results found.  ASSESSMENT & PLAN:   # Thrombocytosis- platelet count [id number] [since February 2016]; normal white count and hemoglobin. Repeat platelet count was 479. Barnabas Lister 2 negative BCR able negative/MPL testing negative. I would recommend checking  for calr testing at next visit. Discussed regarding bone marrow biopsy; however I do not think that is necessary at this time as the platelet counts are just a borderline elevated. If the platelets continue to rise on follow-up- a bone marrow would be reasonable.recommend a baby aspirin every day.  #  Smoker-  Counseled regarding quitting smoking; patient had tried Chantix in the past with success. I recommend that she calls her PCP regarding Chantix.   # lung cancer screening- patient is interested in screening program.   # patient follow-up with me in 6 months with CBC/CALR testing.     Cammie Sickle, MD 03/22/2015 3:28 PM

## 2015-03-22 NOTE — Telephone Encounter (Signed)
Pt had OV yesterday for pre-operative clearance to remove facial cysts. She called back this morning to cancel treadmill myoview. S/w pt to inquire if she has further questions related to the treadmill myoview. Pt states she is unsure why she needs the test as Dr. Fletcher Anon did not appear to stress any urgency. States she got the feeling "he was just doing the test to be doing it". I explained in further detail reasons for the test, her risk factors, and answered questions she had related to the test. Reminded her that while I could answer questions, the final decision is hers. After our discussion, she states she has a much better understanding and unless she calls back, she will proceed with test Jan 25. I have asked her to please notify us no later than Jan 23 if she plans to cancel. She verbalized understanding and is agreeable with plan. Pt is appreciative of the call with no further questions at this time. I will followup with a phone call the day before treadmill myoview.

## 2015-03-22 NOTE — Telephone Encounter (Signed)
Pt would like to cancel her stress test. States she did not get enough information to convince her she had a problem. States she feels as if her Dr.did not seem concerned. And she doesn't feel as if this is necessary. Please call.

## 2015-03-27 ENCOUNTER — Telehealth: Payer: Self-pay | Admitting: *Deleted

## 2015-03-27 NOTE — Telephone Encounter (Signed)
Received referral for low dose lung cancer screening CT scan from Dr. Rogue Bussing . Voicemail left at phone number listed in EMR for patient to call me back to facilitate scheduling scan.

## 2015-03-28 ENCOUNTER — Telehealth: Payer: Self-pay

## 2015-03-28 NOTE — Telephone Encounter (Signed)
Reviewed treadmill myoview instructions w/pt who verbalized understanding and confirmed 7:15am arrival time. Pt had no further questions.

## 2015-03-29 ENCOUNTER — Encounter
Admission: RE | Admit: 2015-03-29 | Discharge: 2015-03-29 | Disposition: A | Discharge status: Home / Self Care | Payer: 59 | Source: Ambulatory Visit | Attending: Cardiovascular Disease | Admitting: Cardiovascular Disease

## 2015-03-29 DIAGNOSIS — Z01818 Encounter for other preprocedural examination: Secondary | ICD-10-CM | POA: Insufficient documentation

## 2015-03-29 DIAGNOSIS — R0602 Shortness of breath: Secondary | ICD-10-CM | POA: Insufficient documentation

## 2015-03-29 LAB — NM MYOCAR MULTI W/SPECT W/WALL MOTION / EF
CHL CUP STRESS STAGE 1 GRADE: 0 %
CHL CUP STRESS STAGE 1 SPEED: 1 mph
CHL CUP STRESS STAGE 2 GRADE: 0 %
CHL CUP STRESS STAGE 2 HR: 80 {beats}/min
CHL CUP STRESS STAGE 2 SPEED: 1 mph
CHL CUP STRESS STAGE 3 DBP: 106 mmHg
CHL CUP STRESS STAGE 5 GRADE: 12 %
CHL CUP STRESS STAGE 5 HR: 137 {beats}/min
CHL CUP STRESS STAGE 5 SPEED: 2.3 mph
CHL CUP STRESS STAGE 6 SPEED: 0 mph
CHL CUP STRESS STAGE 7 GRADE: 0 %
CSEPEDS: 23 s
CSEPEW: 5.8 METS
CSEPHR: 86 %
CSEPPHR: 137 {beats}/min
CSEPPMHR: 86 %
Exercise duration (min): 4 min
LV dias vol: 71 mL
LVSYSVOL: 33 mL
MPHR: 159 {beats}/min
NUC STRESS TID: 0.95
Rest HR: 75 {beats}/min
SDS: 0
SRS: 0
SSS: 0
Stage 1 HR: 80 {beats}/min
Stage 3 Grade: 10 %
Stage 3 HR: 126 {beats}/min
Stage 3 SBP: 214 mmHg
Stage 3 Speed: 1.7 mph
Stage 4 Grade: 12 %
Stage 4 HR: 127 {beats}/min
Stage 4 Speed: 2.5 mph
Stage 6 Grade: 0 %
Stage 6 HR: 121 {beats}/min
Stage 7 DBP: 96 mmHg
Stage 7 HR: 75 {beats}/min
Stage 7 SBP: 168 mmHg
Stage 7 Speed: 0 mph

## 2015-03-29 MED ORDER — TECHNETIUM TC 99M SESTAMIBI - CARDIOLITE
14.4750 | Freq: Once | INTRAVENOUS | Status: AC | PRN
  Administered 2015-03-29: 14.475 via INTRAVENOUS

## 2015-03-29 MED ORDER — TECHNETIUM TC 99M SESTAMIBI - CARDIOLITE
29.7500 | Freq: Once | INTRAVENOUS | Status: AC | PRN
  Administered 2015-03-29: 29.75 via INTRAVENOUS

## 2015-03-31 ENCOUNTER — Telehealth: Payer: Self-pay

## 2015-03-31 MED ORDER — POTASSIUM CHLORIDE CRYS ER 20 MEQ PO TBCR: 20.0000 meq | EXTENDED_RELEASE_TABLET | Freq: Two times a day (BID) | ORAL | Status: AC

## 2015-03-31 NOTE — Pre-Procedure Instructions (Signed)
FAXED 3.3 POTASSIUM RESULT AND + STAPH RESULT OVER TO DR Geoffry Paradise OFFICE WITH FAX CONFIRMATION RECEIVED-ALSO CALLED ANGIE AND LET HER KNOW OF THESE RESULTS

## 2015-03-31 NOTE — Telephone Encounter (Signed)
Patient's pre-op potassium is 3.3. Per protocol, we will start patient on K-Dur 79meq BID x 5 days. (She will take this 1/27-1/31.) Medications were sent to pharmacy at this time.  Called patient at this time to go over results. Informed her of all information above and that medications have been sent to pharmacy.  Patient also informed of her surgery date 2/1 and explained that she would need to call the number at the bottom of the blue surgery form the day before to get her arrival time.   She verbalizes understanding of this information.

## 2015-04-03 ENCOUNTER — Telehealth: Payer: Self-pay | Admitting: *Deleted

## 2015-04-03 ENCOUNTER — Telehealth: Payer: Self-pay | Admitting: Surgery

## 2015-04-03 NOTE — Telephone Encounter (Signed)
Received referral for low dose lung cancer screening CT scan. Voicemail left at phone number listed in EMR for patient to call me back to facilitate scheduling scan.  

## 2015-04-03 NOTE — Telephone Encounter (Signed)
i have called patient to discuss the pre op date/time and sx date. No answer. I have left a message on voicemail. Sx: 04/05/15 with Dr Loflin--Excision of left supraclavicular and submental skin lesion.   I have also called Mel with [facility] to advise her of the change in the surgery date. No answer. I have left a detailed message with all information @ [phone] ext: [phone].

## 2015-04-05 ENCOUNTER — Encounter: Payer: Self-pay | Admitting: *Deleted

## 2015-04-05 ENCOUNTER — Ambulatory Visit: Payer: 59 | Admitting: Anesthesiology

## 2015-04-05 ENCOUNTER — Ambulatory Visit
Admission: RE | Admit: 2015-04-05 | Discharge: 2015-04-05 | Disposition: A | Discharge status: Home / Self Care | Payer: 59 | Source: Ambulatory Visit | Attending: Surgery | Admitting: Surgery

## 2015-04-05 ENCOUNTER — Encounter
Admission: RE | Disposition: A | Discharge status: Home / Self Care | Payer: Self-pay | Source: Ambulatory Visit | Attending: Surgery

## 2015-04-05 DIAGNOSIS — Z90710 Acquired absence of both cervix and uterus: Secondary | ICD-10-CM | POA: Diagnosis not present

## 2015-04-05 DIAGNOSIS — F17210 Nicotine dependence, cigarettes, uncomplicated: Secondary | ICD-10-CM | POA: Insufficient documentation

## 2015-04-05 DIAGNOSIS — Z8249 Family history of ischemic heart disease and other diseases of the circulatory system: Secondary | ICD-10-CM | POA: Insufficient documentation

## 2015-04-05 DIAGNOSIS — M8580 Other specified disorders of bone density and structure, unspecified site: Secondary | ICD-10-CM | POA: Diagnosis not present

## 2015-04-05 DIAGNOSIS — Z8051 Family history of malignant neoplasm of kidney: Secondary | ICD-10-CM | POA: Diagnosis not present

## 2015-04-05 DIAGNOSIS — Z87442 Personal history of urinary calculi: Secondary | ICD-10-CM | POA: Diagnosis not present

## 2015-04-05 DIAGNOSIS — Z7982 Long term (current) use of aspirin: Secondary | ICD-10-CM | POA: Diagnosis not present

## 2015-04-05 DIAGNOSIS — J387 Other diseases of larynx: Secondary | ICD-10-CM | POA: Insufficient documentation

## 2015-04-05 DIAGNOSIS — F419 Anxiety disorder, unspecified: Secondary | ICD-10-CM | POA: Insufficient documentation

## 2015-04-05 DIAGNOSIS — E039 Hypothyroidism, unspecified: Secondary | ICD-10-CM | POA: Insufficient documentation

## 2015-04-05 DIAGNOSIS — E785 Hyperlipidemia, unspecified: Secondary | ICD-10-CM | POA: Insufficient documentation

## 2015-04-05 DIAGNOSIS — I10 Essential (primary) hypertension: Secondary | ICD-10-CM | POA: Insufficient documentation

## 2015-04-05 DIAGNOSIS — L720 Epidermal cyst: Secondary | ICD-10-CM | POA: Diagnosis present

## 2015-04-05 DIAGNOSIS — L723 Sebaceous cyst: Secondary | ICD-10-CM | POA: Diagnosis not present

## 2015-04-05 DIAGNOSIS — E559 Vitamin D deficiency, unspecified: Secondary | ICD-10-CM | POA: Diagnosis not present

## 2015-04-05 DIAGNOSIS — Z79899 Other long term (current) drug therapy: Secondary | ICD-10-CM | POA: Insufficient documentation

## 2015-04-05 DIAGNOSIS — Z8349 Family history of other endocrine, nutritional and metabolic diseases: Secondary | ICD-10-CM | POA: Insufficient documentation

## 2015-04-05 HISTORY — PX: LIPOMA EXCISION: SHX5283

## 2015-04-05 LAB — POCT I-STAT 4, (NA,K, GLUC, HGB,HCT)
GLUCOSE: 96 mg/dL (ref 65–99)
HEMATOCRIT: 42 % (ref 36.0–46.0)
Hemoglobin: 14.3 g/dL (ref 12.0–15.0)
POTASSIUM: 4 mmol/L (ref 3.5–5.1)
Sodium: 142 mmol/L (ref 135–145)

## 2015-04-05 SURGERY — EXCISION LIPOMA
Anesthesia: General | Laterality: Left | Wound class: Clean

## 2015-04-05 MED ORDER — DEXAMETHASONE SODIUM PHOSPHATE 10 MG/ML IJ SOLN
INTRAMUSCULAR | Status: DC | PRN
  Administered 2015-04-05: 8 mg via INTRAVENOUS

## 2015-04-05 MED ORDER — LABETALOL HCL 5 MG/ML IV SOLN
INTRAVENOUS | Status: AC
  Administered 2015-04-05: 5 mg via INTRAVENOUS
  Filled 2015-04-05: qty 4

## 2015-04-05 MED ORDER — GLYCOPYRROLATE 0.2 MG/ML IJ SOLN
INTRAMUSCULAR | Status: DC | PRN
  Administered 2015-04-05: 0.6 mg via INTRAVENOUS

## 2015-04-05 MED ORDER — CHLORHEXIDINE GLUCONATE 4 % EX LIQD: 1.0000 "application " | Freq: Once | CUTANEOUS | Status: DC

## 2015-04-05 MED ORDER — CEFAZOLIN SODIUM-DEXTROSE 2-3 GM-% IV SOLR
INTRAVENOUS | Status: AC
  Administered 2015-04-05: 2 g via INTRAVENOUS
  Filled 2015-04-05: qty 50

## 2015-04-05 MED ORDER — HYDROCODONE-ACETAMINOPHEN 5-325 MG PO TABS
1.0000 | ORAL_TABLET | ORAL | Status: DC | PRN
  Administered 2015-04-05: 1 via ORAL

## 2015-04-05 MED ORDER — BUPIVACAINE HCL (PF) 0.5 % IJ SOLN
INTRAMUSCULAR | Status: AC
  Filled 2015-04-05: qty 30

## 2015-04-05 MED ORDER — CHLORHEXIDINE GLUCONATE 4 % EX LIQD
1.0000 "application " | Freq: Once | CUTANEOUS | Status: AC
  Administered 2015-04-05: 1 via TOPICAL

## 2015-04-05 MED ORDER — MIDAZOLAM HCL 2 MG/2ML IJ SOLN
INTRAMUSCULAR | Status: DC | PRN
  Administered 2015-04-05: 2 mg via INTRAVENOUS

## 2015-04-05 MED ORDER — FENTANYL CITRATE (PF) 100 MCG/2ML IJ SOLN
INTRAMUSCULAR | Status: AC
Start: 2015-04-05 — End: 2015-04-05
  Administered 2015-04-05: 25 ug via INTRAVENOUS
  Filled 2015-04-05: qty 2

## 2015-04-05 MED ORDER — LIDOCAINE HCL (PF) 1 % IJ SOLN
INTRAMUSCULAR | Status: AC
  Filled 2015-04-05: qty 30

## 2015-04-05 MED ORDER — NEOSTIGMINE METHYLSULFATE 10 MG/10ML IV SOLN
INTRAVENOUS | Status: DC | PRN
  Administered 2015-04-05: 3 mg via INTRAVENOUS

## 2015-04-05 MED ORDER — CEFAZOLIN SODIUM-DEXTROSE 2-3 GM-% IV SOLR
2.0000 g | INTRAVENOUS | Status: AC
  Administered 2015-04-05: 2 g via INTRAVENOUS

## 2015-04-05 MED ORDER — ONDANSETRON HCL 4 MG/2ML IJ SOLN
INTRAMUSCULAR | Status: DC | PRN
  Administered 2015-04-05: 4 mg via INTRAVENOUS

## 2015-04-05 MED ORDER — HYDROCODONE-ACETAMINOPHEN 5-325 MG PO TABS
ORAL_TABLET | ORAL | Status: AC
  Filled 2015-04-05: qty 1

## 2015-04-05 MED ORDER — PHENYLEPHRINE HCL 10 MG/ML IJ SOLN
INTRAMUSCULAR | Status: DC | PRN
  Administered 2015-04-05: 100 ug via INTRAVENOUS

## 2015-04-05 MED ORDER — LIDOCAINE HCL (CARDIAC) 20 MG/ML IV SOLN
INTRAVENOUS | Status: DC | PRN
  Administered 2015-04-05: 40 mg via INTRAVENOUS

## 2015-04-05 MED ORDER — ROCURONIUM BROMIDE 100 MG/10ML IV SOLN
INTRAVENOUS | Status: DC | PRN
  Administered 2015-04-05: 30 mg via INTRAVENOUS

## 2015-04-05 MED ORDER — LABETALOL HCL 5 MG/ML IV SOLN
5.0000 mg | Freq: Once | INTRAVENOUS | Status: AC
  Administered 2015-04-05: 5 mg via INTRAVENOUS

## 2015-04-05 MED ORDER — FENTANYL CITRATE (PF) 100 MCG/2ML IJ SOLN
INTRAMUSCULAR | Status: DC | PRN
  Administered 2015-04-05 (×2): 50 ug via INTRAVENOUS

## 2015-04-05 MED ORDER — BUPIVACAINE-EPINEPHRINE (PF) 0.5% -1:200000 IJ SOLN
INTRAMUSCULAR | Status: AC
  Filled 2015-04-05: qty 30

## 2015-04-05 MED ORDER — LACTATED RINGERS IV SOLN
INTRAVENOUS | Status: DC
  Administered 2015-04-05 (×2): via INTRAVENOUS

## 2015-04-05 MED ORDER — SODIUM CHLORIDE 0.9 % IR SOLN
Status: DC | PRN
  Administered 2015-04-05: 1 mL

## 2015-04-05 MED ORDER — PROPOFOL 10 MG/ML IV BOLUS
INTRAVENOUS | Status: DC | PRN
Start: 2015-04-05 — End: 2015-04-05
  Administered 2015-04-05: 150 mg via INTRAVENOUS

## 2015-04-05 MED ORDER — ONDANSETRON HCL 4 MG/2ML IJ SOLN: 4.0000 mg | Freq: Once | INTRAMUSCULAR | Status: DC | PRN

## 2015-04-05 MED ORDER — FENTANYL CITRATE (PF) 100 MCG/2ML IJ SOLN
25.0000 ug | INTRAMUSCULAR | Status: DC | PRN
  Administered 2015-04-05 (×3): 25 ug via INTRAVENOUS

## 2015-04-05 MED ORDER — BUPIVACAINE HCL 0.5 % IJ SOLN
INTRAMUSCULAR | Status: DC | PRN
  Administered 2015-04-05: 4 mL

## 2015-04-05 MED ORDER — HYDROCODONE-ACETAMINOPHEN 5-325 MG PO TABS: 1.0000 | ORAL_TABLET | ORAL | Status: DC | PRN

## 2015-04-05 SURGICAL SUPPLY — 35 items
BLADE SURG 15 STRL LF DISP TIS (BLADE) ×1 IMPLANT
BLADE SURG 15 STRL SS (BLADE) ×2
CHLORAPREP W/TINT 26ML (MISCELLANEOUS) ×3 IMPLANT
CLOSURE WOUND 1/2 X4 (GAUZE/BANDAGES/DRESSINGS)
DRAPE LAPAROTOMY 100X77 ABD (DRAPES) ×3 IMPLANT
DRAPE SHEET LG 3/4 BI-LAMINATE (DRAPES) ×3 IMPLANT
DRESSING TELFA 4X3 1S ST N-ADH (GAUZE/BANDAGES/DRESSINGS) IMPLANT
DRSG TEGADERM 4X4.75 (GAUZE/BANDAGES/DRESSINGS) IMPLANT
ELECT CAUTERY BLADE 6.4 (BLADE) ×3 IMPLANT
ELECT REM PT RETURN 9FT ADLT (ELECTROSURGICAL) ×3
ELECTRODE REM PT RTRN 9FT ADLT (ELECTROSURGICAL) ×1 IMPLANT
GLOVE BIO SURGEON STRL SZ 6.5 (GLOVE) ×2 IMPLANT
GLOVE BIO SURGEONS STRL SZ 6.5 (GLOVE) ×1
GLOVE INDICATOR 7.0 STRL GRN (GLOVE) ×3 IMPLANT
GLOVE PI ORTHOPRO 6.5 (GLOVE) ×2
GLOVE PI ORTHOPRO STRL 6.5 (GLOVE) ×1 IMPLANT
GOWN STRL REUS W/ TWL LRG LVL3 (GOWN DISPOSABLE) ×2 IMPLANT
GOWN STRL REUS W/TWL LRG LVL3 (GOWN DISPOSABLE) ×4
KIT RM TURNOVER STRD PROC AR (KITS) ×3 IMPLANT
LABEL OR SOLS (LABEL) ×3 IMPLANT
LIQUID BAND (GAUZE/BANDAGES/DRESSINGS) ×3 IMPLANT
NEEDLE HYPO 25X1 1.5 SAFETY (NEEDLE) ×3 IMPLANT
NS IRRIG 500ML POUR BTL (IV SOLUTION) ×3 IMPLANT
PACK BASIN MINOR ARMC (MISCELLANEOUS) ×3 IMPLANT
STRIP CLOSURE SKIN 1/2X4 (GAUZE/BANDAGES/DRESSINGS) IMPLANT
SUT MNCRL 3 0 RB1 (SUTURE) ×1 IMPLANT
SUT MNCRL 4-0 (SUTURE) ×2
SUT MNCRL 4-0 27XMFL (SUTURE) ×1
SUT MONOCRYL 3 0 RB1 (SUTURE) ×2
SUT VIC AB 3-0 SH 27 (SUTURE)
SUT VIC AB 3-0 SH 27X BRD (SUTURE) IMPLANT
SUTURE MNCRL 4-0 27XMF (SUTURE) ×1 IMPLANT
SWABSTK COMLB BENZOIN TINCTURE (MISCELLANEOUS) IMPLANT
SYR BULB EAR ULCER 3OZ GRN STR (SYRINGE) IMPLANT
SYRINGE 10CC LL (SYRINGE) ×3 IMPLANT

## 2015-04-05 NOTE — Transfer of Care (Signed)
Immediate Anesthesia Transfer of Care Note  Patient: Mariah Flores  Procedure(s) Performed: Procedure(s): excision submental and supraclavicular lesion left (Left)  Patient Location: PACU  Anesthesia Type:General  Level of Consciousness: sedated  Airway & Oxygen Therapy: Patient Spontanous Breathing and Patient connected to face mask oxygen  Post-op Assessment: Report given to RN and Post -op Vital signs reviewed and stable  Post vital signs: Reviewed and stable  Last Vitals:  Filed Vitals:   04/05/15 1001 04/05/15 1432  BP: 160/88 145/89  Pulse: 76 85  Temp: 37 C 36.7 C  Resp: 16 20    Complications: No apparent anesthesia complications

## 2015-04-05 NOTE — Discharge Instructions (Addendum)
AMBULATORY SURGERY  DISCHARGE INSTRUCTIONS   1) The drugs that you were given will stay in your system until tomorrow so for the next 24 hours you should not:  A) Drive an automobile B) Make any legal decisions C) Drink any alcoholic beverage   2) You may resume regular meals tomorrow.  Today it is better to start with liquids and gradually work up to solid foods.  You may eat anything you prefer, but it is better to start with liquids, then soup and crackers, and gradually work up to solid foods.   3) Please notify your doctor immediately if you have any unusual bleeding, trouble breathing, redness and pain at the surgery site, drainage, fever, or pain not relieved by medication.    4) Additional Instructions:  Please contact your physician with any problems or Same Day Surgery at [phone], Monday through Friday 6 am to 4 pm, or [place] [facility] number at [phone].  PER DR. Azalee Course, SCHEDULE AN APPOINTMENT TO RETURN TO OFFICE WITHIN ONE WEEK.  INCREASE ACTIVITY SLOWLY. YOU MAY SHOWER/BATHE. CALL PHYSICIAN OFFICE FOR TEMPERATURE GREATER THAN 100.5, PERSISTENT NAUSEA AND VOMITTING, FOR PAIN OR REDNESS OR DRAINAGE FROM WOUND AND NO DRIVING WHILE TAKING PAIN MEDICATION.  TAKE NORCO AS NEEDED AND PRESCRIBED FOR PAIN.

## 2015-04-05 NOTE — H&P (View-Only) (Signed)
Subjective:     Patient ID: Mariah Flores, female   DOB: 04/12/1955, 60 y.o.   MRN: [id number]  HPI 60 year old female who had previously lipoma removed from left supraclavicular area and now has return.  Patient states she started noticing it coming back about 2 years ago, initial operation was in 2005, lipoma.  Patient also says she has had a similar place under her chin.  She denies any pain, redness or drainage in the area.  She denies any fever, chills, sob, chest pain, abdominal pain or dysuria.   Past Medical History  Diagnosis Date  . 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 Hyperlipidemia   . Hypothyroidism   . Osteopenia   . Vitamin D deficiency   . Lipoma   . Kidney stones     15 yrs ago  . Hypertension   . Anxiety    Past Surgical History  Procedure Laterality Date  . Total abdominal hysterectomy w/ bilateral salpingoophorectomy  2009   Family History  Problem Relation Age of Onset  . Kidney cancer Father 14  . Heart disease Father   . Cancer Father     Kidney  . Hyperlipidemia Father   . Heart disease Mother   . Hyperlipidemia Mother    Social History   Social History  . Marital Status: Single    Spouse Name: N/A  . Number of Children: N/A  . Years of Education: N/A   Social History Main Topics  . Smoking status: Current Every Day Smoker -- 1.00 packs/day for 40 years    Types: Cigarettes  . Smokeless tobacco: Never Used  . Alcohol Use: No  . Drug Use: No  . Sexual Activity: Not Asked   Other Topics Concern  . None   Social History Narrative    Current outpatient prescriptions:  .  aspirin 81 MG tablet, Take 81 mg by mouth daily., Disp: , Rfl:  .  hydrochlorothiazide (HYDRODIURIL) 25 MG tablet, Take 1 tablet by mouth daily., Disp: , Rfl:  .  ibandronate (BONIVA) 150 MG tablet, Take 1 tablet by mouth daily., Disp: , Rfl:  .  levothyroxine (SYNTHROID, LEVOTHROID) 88 MCG tablet, Take 1 tablet by mouth daily., Disp: , Rfl:  .  PARoxetine (PAXIL) 40 MG  tablet, Take 1 tablet by mouth daily., Disp: , Rfl:  .  simvastatin (ZOCOR) 20 MG tablet, Take 1 tablet by mouth daily., Disp: , Rfl:  .  telmisartan (MICARDIS) 40 MG tablet, Take 1 tablet by mouth daily., Disp: , Rfl:  .  Vitamin D, Ergocalciferol, (DRISDOL) 50000 UNITS CAPS capsule, Take 1 capsule by mouth once a week., Disp: , Rfl:  No Known Allergies   Review of Systems  Constitutional: Negative for fever, chills, activity change, appetite change and fatigue.  HENT: Negative for congestion and sore throat.   Respiratory: Negative for chest tightness, shortness of breath and wheezing.   Cardiovascular: Negative for chest pain, palpitations and leg swelling.  Gastrointestinal: Negative for nausea, vomiting, abdominal pain, diarrhea and constipation.  Genitourinary: Negative for dysuria and hematuria.  Musculoskeletal: Negative for joint swelling, gait problem and neck stiffness.  Skin: Negative for color change, pallor, rash and wound.       Areas as mentioned in HPI   Neurological: Negative for dizziness and tremors.  Hematological: Negative for adenopathy. Does not bruise/bleed easily.  Psychiatric/Behavioral: Negative for agitation. The patient is not nervous/anxious.   All other systems reviewed and are negative.  Objective:   Physical Exam  Constitutional: She is oriented to person, place, and time. She appears well-developed and well-nourished. No distress.  HENT:  Head: Normocephalic and atraumatic.  Right Ear: External ear normal.  Left Ear: External ear normal.  Nose: Nose normal.  Mouth/Throat: Oropharynx is clear and moist. No oropharyngeal exudate.  Eyes: Conjunctivae and EOM are normal. Pupils are equal, round, and reactive to light. No scleral icterus.  Neck: Normal range of motion. Neck supple. No tracheal deviation present. No thyromegaly present.  Cardiovascular: Normal rate, regular rhythm, normal heart sounds and intact distal pulses.  Exam reveals no  gallop and no friction rub.   No murmur heard. Pulmonary/Chest: Effort normal and breath sounds normal. No respiratory distress. She has no wheezes.  Abdominal: Soft. Bowel sounds are normal. She exhibits no distension. There is no tenderness.  Musculoskeletal: Normal range of motion. She exhibits no edema or tenderness.  Lymphadenopathy:    She has no cervical adenopathy.  Neurological: She is alert and oriented to person, place, and time. No cranial nerve deficit.  Skin: Skin is warm and dry. No rash noted. No erythema. No pallor.  1cm nodule in submental area with punctum, no erythema or drainage, appear to be EIC  1cm nodule in most inferior portion of left supraclavicular scar, ? If second EIC or suture granuloma  Left forearm <43mm area of stuck on, flaky appearing skin with regular boarders, appear to be Acitinic  keratosis   Psychiatric: She has a normal mood and affect. Her behavior is normal. Judgment and thought content normal.       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  Patient expressed understanding and would like have these removed but due to anxiety would like them done in the OR.  Will schedule her for excision removal x 2.

## 2015-04-05 NOTE — Interval H&P Note (Signed)
History and Physical Interval Note:  04/05/2015 12:43 PM  Mariah Flores  has presented today for surgery, with the diagnosis of LIPOMA  The various methods of treatment have been discussed with the patient and family. After consideration of risks, benefits and other options for treatment, the patient has consented to  Procedure(s): EXCISION LIPOMA (Left) as a surgical intervention .  The patient's history has been reviewed, patient examined, no change in status, stable for surgery.  I have reviewed the patient's chart and labs.  Questions were answered to the patient's satisfaction.     Tanya Marvin L Dewie Ahart

## 2015-04-05 NOTE — Telephone Encounter (Signed)
Authorization number [id number] for outpatient surgery scheduled for 04/05/15 with Dr Azalee Course. Per Mel with [employer].

## 2015-04-05 NOTE — OR Nursing (Signed)
Pain is worsening for her and has requested pain medication.Waiting for her husband to arrive.

## 2015-04-05 NOTE — Brief Op Note (Signed)
04/05/2015  2:43 PM  PATIENT:  Mariah Flores  60 y.o. female  PRE-OPERATIVE DIAGNOSIS:  submental & supraclavicular lesion left  POST-OPERATIVE DIAGNOSIS:  submental & supraclavicular lesion left  PROCEDURE:  Procedure(s): excision submental and supraclavicular lesion left (Left)  SURGEON:  Surgeon(s) and Role:    * Hubbard Robinson, MD - Primary  PHYSICIAN ASSISTANT:   ASSISTANTS: none   ANESTHESIA:   local and general  EBL:  Total I/O In: 500 [I.V.:500] Out: -   BLOOD ADMINISTERED:none  DRAINS: none   LOCAL MEDICATIONS USED:  MARCAINE     SPECIMEN:  Excision  DISPOSITION OF SPECIMEN:  PATHOLOGY  COUNTS:  YES  TOURNIQUET:  * No tourniquets in log *  DICTATION: .Dragon Dictation  PLAN OF CARE: Discharge to home after PACU  PATIENT DISPOSITION:  PACU - hemodynamically stable.   Delay start of Pharmacological VTE agent (>24hrs) due to surgical blood loss or risk of bleeding: no

## 2015-04-05 NOTE — Op Note (Signed)
Sebaceous Cyst Excision Procedure Note  Pre-operative Diagnosis: Epidermal cyst of left supraclavicular region and submental midline  Post-operative Diagnosis: same  Locations:left supraclavicular and submental midline  Indications: Pain, drainage from these lesions  Surgeon: Susa Griffins, MD  Anesthesia:GETA and local with Bupivacaine 0.25% without epinephrine without added sodium bicarbonate  Procedure Details  Patient informed of the risks (including bleeding and infection) and benefits of the  procedure and Written informed consent obtained.  Patient was taken to the operating room and placed in the bed in the supine position.  Anesthesia was induced and patient was intubated without any difficulty.  A time out was performed to confirm patient and appropriate area.  The chin, neck and left chest were prepped and draped in the usual sterile fashion. An incision was made over the cyst in the left supraclavicular region, which was dissected free of the surrounding tissue and removed.  The cyst was filled with typical sebaceous material.  The wound was closed with 4-0 Monocryl using a running subcuticular stitch.    Attention was then turned to submental area.  Likewise an ellipitical incision was made around the cyst,dissected free from the subcutaneous tissues and removed.  Hemostasis was achieved with electrocautery.  The incision was then closed with a subcutaneous interrupted stitches of 3-0 Monocryl and a running subcuticular layer of 4-0 Monocryl The specimens were sent for pathologic examination. The patient tolerated the procedure well.  All sponge and instrument counts were correct at the end of the case.   EBL: 34ml  Findings: Cysts in both areas, sent to pathology, Also noted right supraglottic mass when anesthesia was intubating, will have f/u with ENT  Condition: Stable  Complications: none

## 2015-04-05 NOTE — Anesthesia Preprocedure Evaluation (Addendum)
Anesthesia Evaluation  Patient identified by MRN, date of birth, ID band Patient awake    Reviewed: Allergy & Precautions, NPO status , Patient's Chart, lab work & pertinent test results, reviewed documented beta blocker date and time   Airway Mallampati: II  TM Distance: >3 FB     Dental  (+) Chipped   Pulmonary shortness of breath, Current Smoker,           Cardiovascular hypertension, Pt. on medications      Neuro/Psych Anxiety    GI/Hepatic   Endo/Other  Hypothyroidism   Renal/GU Renal InsufficiencyRenal disease     Musculoskeletal   Abdominal   Peds  Hematology  (+) Blood dyscrasia, ,   Anesthesia Other Findings Smokes. EKG OK. Echo OK.  Reproductive/Obstetrics                            Anesthesia Physical Anesthesia Plan  ASA: III  Anesthesia Plan: General   Post-op Pain Management:    Induction: Intravenous  Airway Management Planned: Oral ETT  Additional Equipment:   Intra-op Plan:   Post-operative Plan:   Informed Consent: I have reviewed the patients History and Physical, chart, labs and discussed the procedure including the risks, benefits and alternatives for the proposed anesthesia with the patient or authorized representative who has indicated his/her understanding and acceptance.     Plan Discussed with: CRNA  Anesthesia Plan Comments:         Anesthesia Quick Evaluation

## 2015-04-05 NOTE — Anesthesia Procedure Notes (Signed)
Procedure Name: Intubation Date/Time: 04/05/2015 1:30 PM Performed by: Allean Found Pre-anesthesia Checklist: Patient identified, Emergency Drugs available, Suction available, Patient being monitored and Timeout performed Patient Re-evaluated:Patient Re-evaluated prior to inductionOxygen Delivery Method: Circle system utilized Preoxygenation: Pre-oxygenation with 100% oxygen Intubation Type: IV induction Laryngoscope Size: Mac, 3 and McGraph Grade View: Grade II Tube type: Oral Tube size: 7.5 mm Number of attempts: 2 Placement Confirmation: ETT inserted through vocal cords under direct vision,  positive ETCO2 and breath sounds checked- equal and bilateral Secured at: 21 cm Dental Injury: Teeth and Oropharynx as per pre-operative assessment  Comments: Upon first DL, mass noted above the epiglottis.  Removed MAC blade to visualize with McGraph.  Could not view w/ McGraph.  Intubated easily w/7.5 ett.  Cuff up.  Performed third DL to show surgeon with Mac3 blade.

## 2015-04-06 ENCOUNTER — Encounter: Payer: Self-pay | Admitting: Surgery

## 2015-04-06 ENCOUNTER — Telehealth: Payer: Self-pay | Admitting: Surgery

## 2015-04-06 NOTE — Telephone Encounter (Signed)
Patient has called and would like to know how to keep the surgical site clean. Surgery was 04/05/15 with Dr Loflin--Excision of cyst on neck and chin.

## 2015-04-06 NOTE — Anesthesia Postprocedure Evaluation (Signed)
Anesthesia Post Note  Patient: Mariah Flores  Procedure(s) Performed: Procedure(s) (LRB): excision submental and supraclavicular lesion left (Left)  Patient location during evaluation: PACU Anesthesia Type: General Level of consciousness: awake and alert Pain management: pain level controlled Vital Signs Assessment: post-procedure vital signs reviewed and stable Respiratory status: spontaneous breathing, nonlabored ventilation, respiratory function stable and patient connected to nasal cannula oxygen Cardiovascular status: blood pressure returned to baseline and stable Postop Assessment: no signs of nausea or vomiting Anesthetic complications: no    Last Vitals:  Filed Vitals:   04/05/15 1559 04/05/15 1642  BP: 176/92 155/89  Pulse: 91 89  Temp: 37.1 C   Resp: 16 16    Last Pain:  Filed Vitals:   04/05/15 1643  PainSc: [place]

## 2015-04-07 NOTE — Telephone Encounter (Signed)
Patient has called back. I have advised her that the purple at the site of surgery is the glue. I also advised her not to get the site wet within 48 hours of her surgery. Patient understands.   The patient also had questions about the pathology results... I have told her that the results could take up to 1 week,  once the pathology report comes back we will contact her with results.

## 2015-04-10 LAB — SURGICAL PATHOLOGY

## 2015-04-14 ENCOUNTER — Ambulatory Visit (INDEPENDENT_AMBULATORY_CARE_PROVIDER_SITE_OTHER): Payer: 59 | Admitting: Surgery

## 2015-04-14 ENCOUNTER — Encounter: Payer: Self-pay | Admitting: Surgery

## 2015-04-14 VITALS — BP 192/102 | HR 89 | Temp 98.1°F | Ht 62.0 in | Wt 157.0 lb

## 2015-04-14 DIAGNOSIS — L720 Epidermal cyst: Secondary | ICD-10-CM

## 2015-04-14 DIAGNOSIS — J387 Other diseases of larynx: Secondary | ICD-10-CM

## 2015-04-14 DIAGNOSIS — D179 Benign lipomatous neoplasm, unspecified: Secondary | ICD-10-CM

## 2015-04-14 NOTE — Progress Notes (Signed)
60 year old status post excision of chin and left supraclavicular sebaceous cyst. Patient did well afterwards but did clean the chin incision with peroxide and pulled the glue off today she's been started to have some drainage and increased tenderness in the area.  Filed Vitals:   04/14/15 1454  BP: 192/102  Pulse: 89  Temp: 98.1 F (36.7 C)   PE:  Gen: NAD Chin: incision clean, small hole size of pinpoint with some drainage, no erythema  Left supraclavicular incision c/d/i, stitch spitting that was cut.   A/P:  Patient healing well status post cyst removal.   Instructed that this area under the chin will still drained to clean with peroxide and use a dry dressing if there is drainage. She is to call if there are any further issues. I also discussed with her  She had a right supraglottic mass seen with intubation. Also described to her that with this mass and her smoking history she needed to be followed up with an ENT surgeon to make sure that this mass was not cancerous. Patient expressed understanding and we will call her with an appointment.

## 2015-05-02 ENCOUNTER — Telehealth: Payer: Self-pay

## 2015-05-02 NOTE — Telephone Encounter (Signed)
Was informed by Dr. Azalee Course that patient needs ENT appointment for Right Supraglottic mass seen during intubation.  [facility] called at this time. Spoke with Estill Bamberg.  Appointment scheduled for 05/03/15 at 0830am with Dr. Tami Ribas.

## 2015-05-02 NOTE — Telephone Encounter (Signed)
Notes faxed over to [facility] with Attn: Estill Bamberg at this time [phone].

## 2015-05-02 NOTE — Telephone Encounter (Signed)
Called patient with appointment information. She is planning on going to this appointment. Directions were given at this time to patient.

## 2015-05-08 ENCOUNTER — Telehealth: Payer: Self-pay | Admitting: Surgery

## 2015-05-08 NOTE — Telephone Encounter (Signed)
I put visit notes in the patients chart under media for you to see.

## 2015-05-08 NOTE — Telephone Encounter (Signed)
Notes reviewed at this time.

## 2015-09-21 ENCOUNTER — Inpatient Hospital Stay: Payer: 59 | Admitting: Internal Medicine

## 2015-09-21 ENCOUNTER — Inpatient Hospital Stay: Payer: 59

## 2016-11-02 ENCOUNTER — Inpatient Hospital Stay (HOSPITAL_COMMUNITY): Payer: Self-pay

## 2016-11-02 ENCOUNTER — Inpatient Hospital Stay (HOSPITAL_COMMUNITY)
Admission: EM | Admit: 2016-11-02 | Discharge: 2016-12-02 | DRG: 064 | Disposition: E | Payer: Self-pay | Attending: Neurology | Admitting: Neurology

## 2016-11-02 ENCOUNTER — Encounter (HOSPITAL_COMMUNITY): Payer: Self-pay | Admitting: *Deleted

## 2016-11-02 ENCOUNTER — Emergency Department (HOSPITAL_COMMUNITY): Payer: Self-pay

## 2016-11-02 DIAGNOSIS — Z7982 Long term (current) use of aspirin: Secondary | ICD-10-CM

## 2016-11-02 DIAGNOSIS — G8191 Hemiplegia, unspecified affecting right dominant side: Secondary | ICD-10-CM | POA: Diagnosis present

## 2016-11-02 DIAGNOSIS — I10 Essential (primary) hypertension: Secondary | ICD-10-CM | POA: Diagnosis present

## 2016-11-02 DIAGNOSIS — Z66 Do not resuscitate: Secondary | ICD-10-CM | POA: Diagnosis present

## 2016-11-02 DIAGNOSIS — R29728 NIHSS score 28: Secondary | ICD-10-CM | POA: Diagnosis present

## 2016-11-02 DIAGNOSIS — Z79899 Other long term (current) drug therapy: Secondary | ICD-10-CM

## 2016-11-02 DIAGNOSIS — H5347 Heteronymous bilateral field defects: Secondary | ICD-10-CM | POA: Diagnosis present

## 2016-11-02 DIAGNOSIS — G935 Compression of brain: Secondary | ICD-10-CM | POA: Diagnosis present

## 2016-11-02 DIAGNOSIS — E559 Vitamin D deficiency, unspecified: Secondary | ICD-10-CM | POA: Diagnosis present

## 2016-11-02 DIAGNOSIS — J9601 Acute respiratory failure with hypoxia: Secondary | ICD-10-CM | POA: Diagnosis present

## 2016-11-02 DIAGNOSIS — I615 Nontraumatic intracerebral hemorrhage, intraventricular: Principal | ICD-10-CM | POA: Diagnosis present

## 2016-11-02 DIAGNOSIS — Z9289 Personal history of other medical treatment: Secondary | ICD-10-CM

## 2016-11-02 DIAGNOSIS — Z515 Encounter for palliative care: Secondary | ICD-10-CM | POA: Diagnosis present

## 2016-11-02 DIAGNOSIS — G936 Cerebral edema: Secondary | ICD-10-CM | POA: Diagnosis present

## 2016-11-02 DIAGNOSIS — Z7983 Long term (current) use of bisphosphonates: Secondary | ICD-10-CM

## 2016-11-02 DIAGNOSIS — I610 Nontraumatic intracerebral hemorrhage in hemisphere, subcortical: Secondary | ICD-10-CM | POA: Diagnosis present

## 2016-11-02 DIAGNOSIS — E876 Hypokalemia: Secondary | ICD-10-CM | POA: Diagnosis present

## 2016-11-02 DIAGNOSIS — Z90722 Acquired absence of ovaries, bilateral: Secondary | ICD-10-CM

## 2016-11-02 DIAGNOSIS — Z90710 Acquired absence of both cervix and uterus: Secondary | ICD-10-CM

## 2016-11-02 DIAGNOSIS — E039 Hypothyroidism, unspecified: Secondary | ICD-10-CM | POA: Diagnosis present

## 2016-11-02 DIAGNOSIS — F17210 Nicotine dependence, cigarettes, uncomplicated: Secondary | ICD-10-CM | POA: Diagnosis present

## 2016-11-02 DIAGNOSIS — G9340 Encephalopathy, unspecified: Secondary | ICD-10-CM | POA: Diagnosis present

## 2016-11-02 DIAGNOSIS — F419 Anxiety disorder, unspecified: Secondary | ICD-10-CM | POA: Diagnosis present

## 2016-11-02 DIAGNOSIS — R4701 Aphasia: Secondary | ICD-10-CM | POA: Diagnosis present

## 2016-11-02 DIAGNOSIS — E785 Hyperlipidemia, unspecified: Secondary | ICD-10-CM | POA: Diagnosis present

## 2016-11-02 DIAGNOSIS — I619 Nontraumatic intracerebral hemorrhage, unspecified: Secondary | ICD-10-CM

## 2016-11-02 DIAGNOSIS — G919 Hydrocephalus, unspecified: Secondary | ICD-10-CM | POA: Diagnosis present

## 2016-11-02 DIAGNOSIS — M8580 Other specified disorders of bone density and structure, unspecified site: Secondary | ICD-10-CM | POA: Diagnosis present

## 2016-11-02 HISTORY — DX: Carpal tunnel syndrome, unspecified upper limb: G56.00

## 2016-11-02 LAB — RAPID URINE DRUG SCREEN, HOSP PERFORMED
Amphetamines: NOT DETECTED
Barbiturates: NOT DETECTED
Benzodiazepines: NOT DETECTED
Cocaine: NOT DETECTED
Opiates: NOT DETECTED
TETRAHYDROCANNABINOL: NOT DETECTED

## 2016-11-02 LAB — BLOOD GAS, ARTERIAL
ACID-BASE EXCESS: 3.9 mmol/L — AB (ref 0.0–2.0)
BICARBONATE: 29.3 mmol/L — AB (ref 20.0–28.0)
Drawn by: 313941
FIO2: 100
O2 Saturation: 99.6 %
PEEP: 5 cmH2O
PO2 ART: 463 mmHg — AB (ref 83.0–108.0)
Patient temperature: 97.9
RATE: 16 resp/min
VT: 420 mL
pCO2 arterial: 55.1 mmHg — ABNORMAL HIGH (ref 32.0–48.0)
pH, Arterial: 7.342 — ABNORMAL LOW (ref 7.350–7.450)

## 2016-11-02 LAB — URINALYSIS, ROUTINE W REFLEX MICROSCOPIC
BILIRUBIN URINE: NEGATIVE
Bacteria, UA: NONE SEEN
GLUCOSE, UA: NEGATIVE mg/dL
HGB URINE DIPSTICK: NEGATIVE
KETONES UR: NEGATIVE mg/dL
LEUKOCYTES UA: NEGATIVE
NITRITE: NEGATIVE
PH: 7 (ref 5.0–8.0)
Protein, ur: 30 mg/dL — AB
Specific Gravity, Urine: 1.008 (ref 1.005–1.030)

## 2016-11-02 LAB — CBC
HEMATOCRIT: 42 % (ref 36.0–46.0)
HEMOGLOBIN: 14.3 g/dL (ref 12.0–15.0)
MCH: 27.2 pg (ref 26.0–34.0)
MCHC: 34 g/dL (ref 30.0–36.0)
MCV: 79.8 fL (ref 78.0–100.0)
Platelets: 490 10*3/uL — ABNORMAL HIGH (ref 150–400)
RBC: 5.26 MIL/uL — AB (ref 3.87–5.11)
RDW: 14 % (ref 11.5–15.5)
WBC: 11.4 10*3/uL — AB (ref 4.0–10.5)

## 2016-11-02 LAB — I-STAT CHEM 8, ED
BUN: 13 mg/dL (ref 6–20)
CHLORIDE: 97 mmol/L — AB (ref 101–111)
CREATININE: 0.6 mg/dL (ref 0.44–1.00)
Calcium, Ion: 1.04 mmol/L — ABNORMAL LOW (ref 1.15–1.40)
GLUCOSE: 152 mg/dL — AB (ref 65–99)
HCT: 46 % (ref 36.0–46.0)
Hemoglobin: 15.6 g/dL — ABNORMAL HIGH (ref 12.0–15.0)
POTASSIUM: 2.5 mmol/L — AB (ref 3.5–5.1)
Sodium: 137 mmol/L (ref 135–145)
TCO2: 27 mmol/L (ref 22–32)

## 2016-11-02 LAB — DIFFERENTIAL
BASOS ABS: 0 10*3/uL (ref 0.0–0.1)
Basophils Relative: 0 %
Eosinophils Absolute: 0.5 10*3/uL (ref 0.0–0.7)
Eosinophils Relative: 4 %
LYMPHS ABS: 3 10*3/uL (ref 0.7–4.0)
Lymphocytes Relative: 27 %
MONOS PCT: 5 %
Monocytes Absolute: 0.6 10*3/uL (ref 0.1–1.0)
NEUTROS ABS: 7.3 10*3/uL (ref 1.7–7.7)
Neutrophils Relative %: 64 %

## 2016-11-02 LAB — TRIGLYCERIDES
Triglycerides: 110 mg/dL (ref <150)
Triglycerides: 132 mg/dL (ref <150)

## 2016-11-02 LAB — I-STAT TROPONIN, ED: TROPONIN I, POC: 0.02 ng/mL (ref 0.00–0.08)

## 2016-11-02 LAB — ETHANOL: Alcohol, Ethyl (B): <5 mg/dL (ref <5)

## 2016-11-02 LAB — CBG MONITORING, ED: Glucose-Capillary: 148 mg/dL — ABNORMAL HIGH (ref 65–99)

## 2016-11-02 LAB — MAGNESIUM: Magnesium: 1.9 mg/dL (ref 1.7–2.4)

## 2016-11-02 LAB — MRSA PCR SCREENING: MRSA by PCR: NEGATIVE

## 2016-11-02 LAB — PROTIME-INR
INR: 0.99
Prothrombin Time: 13 seconds (ref 11.4–15.2)

## 2016-11-02 LAB — APTT: APTT: 27 s (ref 24–36)

## 2016-11-02 MED ORDER — PANTOPRAZOLE SODIUM 40 MG IV SOLR
40.0000 mg | Freq: Every day | INTRAVENOUS | Status: DC
  Administered 2016-11-02 – 2016-11-04 (×3): 40 mg via INTRAVENOUS
  Filled 2016-11-02 (×3): qty 40

## 2016-11-02 MED ORDER — FENTANYL CITRATE (PF) 100 MCG/2ML IJ SOLN: 100.0000 ug | INTRAMUSCULAR | Status: DC | PRN

## 2016-11-02 MED ORDER — FAMOTIDINE IN NACL 20-0.9 MG/50ML-% IV SOLN: 20.0000 mg | Freq: Two times a day (BID) | INTRAVENOUS | Status: DC

## 2016-11-02 MED ORDER — ONDANSETRON HCL 4 MG/2ML IJ SOLN
4.0000 mg | Freq: Once | INTRAMUSCULAR | Status: AC
  Administered 2016-11-02: 4 mg via INTRAVENOUS

## 2016-11-02 MED ORDER — NICARDIPINE HCL IN NACL 20-0.86 MG/200ML-% IV SOLN
0.0000 mg/h | INTRAVENOUS | Status: DC
Start: 2016-11-02 — End: 2016-11-05
  Administered 2016-11-02: 5 mg/h via INTRAVENOUS
  Administered 2016-11-03 (×2): 2.5 mg/h via INTRAVENOUS
  Administered 2016-11-03: 5 mg/h via INTRAVENOUS
  Administered 2016-11-04 (×2): 2.5 mg/h via INTRAVENOUS
  Administered 2016-11-05: 5 mg/h via INTRAVENOUS
  Filled 2016-11-02 (×7): qty 200

## 2016-11-02 MED ORDER — FENTANYL CITRATE (PF) 100 MCG/2ML IJ SOLN
25.0000 ug | INTRAMUSCULAR | Status: DC | PRN
  Administered 2016-11-02 – 2016-11-03 (×2): 50 ug via INTRAVENOUS
  Administered 2016-11-05: 100 ug via INTRAVENOUS
  Filled 2016-11-02 (×3): qty 2

## 2016-11-02 MED ORDER — PROPOFOL 1000 MG/100ML IV EMUL
5.0000 ug/kg/min | INTRAVENOUS | Status: DC
  Administered 2016-11-02: 50 ug/kg/min via INTRAVENOUS
  Filled 2016-11-02: qty 100

## 2016-11-02 MED ORDER — PROPOFOL 1000 MG/100ML IV EMUL
0.0000 ug/kg/min | INTRAVENOUS | Status: DC
  Administered 2016-11-02 (×2): 50 ug/kg/min via INTRAVENOUS
  Administered 2016-11-03: 40 ug/kg/min via INTRAVENOUS
  Administered 2016-11-03: 50 ug/kg/min via INTRAVENOUS
  Administered 2016-11-03: 30 ug/kg/min via INTRAVENOUS
  Administered 2016-11-04 – 2016-11-05 (×4): 40 ug/kg/min via INTRAVENOUS
  Filled 2016-11-02 (×9): qty 100

## 2016-11-02 MED ORDER — ACETAMINOPHEN 160 MG/5ML PO SOLN
650.0000 mg | ORAL | Status: DC | PRN
  Administered 2016-11-04: 650 mg
  Filled 2016-11-02: qty 20.3

## 2016-11-02 MED ORDER — POTASSIUM CHLORIDE 20 MEQ/15ML (10%) PO SOLN
40.0000 meq | Freq: Once | ORAL | Status: AC
Start: 2016-11-02 — End: 2016-11-02
  Administered 2016-11-02: 40 meq
  Filled 2016-11-02: qty 30

## 2016-11-02 MED ORDER — MIDAZOLAM HCL 2 MG/2ML IJ SOLN
1.0000 mg | INTRAMUSCULAR | Status: DC | PRN
  Administered 2016-11-03 – 2016-11-05 (×2): 2 mg via INTRAVENOUS
  Filled 2016-11-02 (×3): qty 2

## 2016-11-02 MED ORDER — ETOMIDATE 2 MG/ML IV SOLN
INTRAVENOUS | Status: AC | PRN
  Administered 2016-11-02: 20 mg via INTRAVENOUS

## 2016-11-02 MED ORDER — ONDANSETRON HCL 4 MG/2ML IJ SOLN
INTRAMUSCULAR | Status: AC
  Filled 2016-11-02: qty 2

## 2016-11-02 MED ORDER — ONDANSETRON HCL 4 MG/2ML IJ SOLN
4.0000 mg | Freq: Once | INTRAMUSCULAR | Status: AC
  Administered 2016-11-02: 4 mg via INTRAVENOUS
  Filled 2016-11-02: qty 2

## 2016-11-02 MED ORDER — NICARDIPINE HCL IN NACL 20-0.86 MG/200ML-% IV SOLN
INTRAVENOUS | Status: AC
  Administered 2016-11-02: 5 mg
  Filled 2016-11-02: qty 200

## 2016-11-02 MED ORDER — ROCURONIUM BROMIDE 50 MG/5ML IV SOLN
INTRAVENOUS | Status: AC | PRN
  Administered 2016-11-02: 70 mg via INTRAVENOUS

## 2016-11-02 MED ORDER — MIDAZOLAM HCL 2 MG/2ML IJ SOLN: 2.0000 mg | INTRAMUSCULAR | Status: DC | PRN

## 2016-11-02 MED ORDER — STROKE: EARLY STAGES OF RECOVERY BOOK
Freq: Once | Status: AC
  Administered 2016-11-02: 17:00:00
  Filled 2016-11-02: qty 1

## 2016-11-02 MED ORDER — ORAL CARE MOUTH RINSE: 15.0000 mL | Freq: Four times a day (QID) | OROMUCOSAL | Status: DC

## 2016-11-02 MED ORDER — POTASSIUM CHLORIDE 10 MEQ/100ML IV SOLN
10.0000 meq | INTRAVENOUS | Status: AC
  Administered 2016-11-02 (×4): 10 meq via INTRAVENOUS
  Filled 2016-11-02 (×4): qty 100

## 2016-11-02 MED ORDER — ACETAMINOPHEN 325 MG PO TABS
650.0000 mg | ORAL_TABLET | ORAL | Status: DC | PRN
  Filled 2016-11-02: qty 2

## 2016-11-02 MED ORDER — SENNOSIDES-DOCUSATE SODIUM 8.6-50 MG PO TABS: 1.0000 | ORAL_TABLET | Freq: Two times a day (BID) | ORAL | Status: DC

## 2016-11-02 MED ORDER — ORAL CARE MOUTH RINSE
15.0000 mL | OROMUCOSAL | Status: DC
  Administered 2016-11-02 – 2016-11-05 (×28): 15 mL via OROMUCOSAL

## 2016-11-02 MED ORDER — ACETAMINOPHEN 650 MG RE SUPP: 650.0000 mg | RECTAL | Status: DC | PRN

## 2016-11-02 MED ORDER — CHLORHEXIDINE GLUCONATE 0.12% ORAL RINSE (MEDLINE KIT)
15.0000 mL | Freq: Two times a day (BID) | OROMUCOSAL | Status: DC
  Administered 2016-11-02 – 2016-11-05 (×6): 15 mL via OROMUCOSAL

## 2016-11-02 MED ORDER — LABETALOL HCL 5 MG/ML IV SOLN
20.0000 mg | Freq: Once | INTRAVENOUS | Status: AC
  Administered 2016-11-02: 20 mg via INTRAVENOUS

## 2016-11-02 MED ORDER — CHLORHEXIDINE GLUCONATE 0.12% ORAL RINSE (MEDLINE KIT)
15.0000 mL | Freq: Two times a day (BID) | OROMUCOSAL | Status: DC
  Administered 2016-11-02: 15 mL via OROMUCOSAL

## 2016-11-02 NOTE — Progress Notes (Addendum)
Neurology aware of CT results. Follow up head CT tonight @ 21:00 per MD. Will continue to monitor.

## 2016-11-02 NOTE — Progress Notes (Signed)
Pt transported to and from CT without event.  Rt will continue to monitor.

## 2016-11-02 NOTE — ED Notes (Signed)
CCM MD at bedside.  

## 2016-11-02 NOTE — Progress Notes (Addendum)
On arrival to ICU, pt settled and vital signs obtained. Cardene increased d/t SBP >140. Patient moving all 4 extremities L > R non-purposeful to pain, +cough, L pupil 4 mm slightly irregular nonreactive, R pupil 2 mm round very sluggish. Dr. Leonel Ramsay paged and made aware of increase in anosocoria. Head CT ordered. MD came to see pt. L pupil very sluggish per MD. BP responsive to increase in cardene. Will continue to monitor & obtain head CT. Family updated at bedside.

## 2016-11-02 NOTE — ED Notes (Signed)
Dr Tegeler notified of K of 2.6

## 2016-11-02 NOTE — ED Triage Notes (Signed)
LSN 1130.  Husband heard his wife making loud noises and when he came into the room his wife was laying on the floor, moaning and not making sense.  R sided flacidity and aphasia.  Upon arrival, pt alert and able to follow commands, pupils equal and reactive.  Protecting airway.

## 2016-11-02 NOTE — Consult Note (Signed)
PULMONARY / CRITICAL CARE MEDICINE   Name: Mariah Flores MRN: [id number] DOB: 1955-07-30    ADMISSION DATE:  11/28/2016 CONSULTATION DATE:  11/30/2016  REFERRING MD:  Dr. Leonel Ramsay  CHIEF COMPLAINT:  Headache, nausea or vomiting  HISTORY OF PRESENT ILLNESS:   61 year old female with a past medical history significant for hypertension was brought to the emergency department by emergency medical services after her husband noted the sudden onset of nausea vomiting, slurred speech. She had been in her usual state of health until this morning when the symptoms developed suddenly. She was intubated on my exam and cannot provide history. EMS brought her to the emergency department where she initially was able to speak but her mental status worsened so she was intubated. She was found to have an intracerebral hemorrhage. Neurosurgery is evaluating her. Pulmonary and critical care medicine was consulted for ventilator management.  PAST MEDICAL HISTORY :  She  has a past medical history of Anxiety; Blood dyscrasia; Carpal tunnel syndrome; Elevated platelet count; History of MRSA infection; Hyperlipidemia; Hypertension; Hypothyroidism; Kidney stones; Lipoma; Osteopenia; and Vitamin D deficiency.  PAST SURGICAL HISTORY: She  has a past surgical history that includes Total abdominal hysterectomy w/ bilateral salpingoophorectomy (2009) and Lipoma excision (Left, 04/05/2015).  No Known Allergies  No current facility-administered medications on file prior to encounter.    Current Outpatient Prescriptions on File Prior to Encounter  Medication Sig  . aspirin 81 MG tablet Take 81 mg by mouth daily.  . hydrochlorothiazide (HYDRODIURIL) 25 MG tablet Take 1 tablet by mouth daily.  Marland Kitchen ibandronate (BONIVA) 150 MG tablet Take 1 tablet by mouth every 30 (thirty) days.   Marland Kitchen levothyroxine (SYNTHROID, LEVOTHROID) 88 MCG tablet Take 1 tablet by mouth daily.  Marland Kitchen omeprazole (PRILOSEC) 20 MG capsule Take 20 mg by mouth  as needed.  Marland Kitchen PARoxetine (PAXIL) 40 MG tablet Take 1 tablet by mouth every morning.   . potassium chloride SA (K-DUR,KLOR-CON) 20 MEQ tablet Take 1 tablet (20 mEq total) by mouth 2 (two) times daily.  . simvastatin (ZOCOR) 20 MG tablet Take 1 tablet by mouth daily.  Marland Kitchen telmisartan (MICARDIS) 40 MG tablet Take 1 tablet by mouth every morning.     FAMILY HISTORY:  Her indicated that the status of her mother is unknown. She indicated that the status of her father is unknown.    SOCIAL HISTORY: She  reports that she has been smoking Cigarettes.  She has a 40.00 pack-year smoking history. She has never used smokeless tobacco. She reports that she does not drink alcohol or use drugs.  REVIEW OF SYSTEMS:   Cannot obtain due to intubation  SUBJECTIVE:  As above  VITAL SIGNS: BP 123/73 Comment: cardene titrated  Pulse 73   Temp 97.9 F (36.6 C) (Oral)   Resp 19   Ht 5' 3"  (1.6 m)   Wt 66.4 kg (146 lb 6.2 oz)   SpO2 100%   BMI 25.93 kg/m   HEMODYNAMICS:    VENTILATOR SETTINGS: Vent Mode: PRVC FiO2 (%):  [100 %] 100 % Set Rate:  [16 bmp] 16 bmp Vt Set:  [420 mL] 420 mL PEEP:  [5 cmH20] 5 cmH20 Plateau Pressure:  [20 cmH20] 20 cmH20  INTAKE / OUTPUT: No intake/output data recorded.  PHYSICAL EXAMINATION: General:  In bed on vent HENT: NCAT ETT in place PULM: CTA B, vent supported breathing CV: RRR, no mgr GI: BS+, soft, nontender MSK: normal bulk and tone Neuro: sedated on vent   LABS:  BMET  Recent Labs Lab 11/05/2016 1332 11/28/2016 1334  NA 136 137  K 2.6* 2.5*  CL 98* 97*  CO2 24  --   BUN 10 13  CREATININE 0.74 0.60  GLUCOSE 155* 152*    Electrolytes  Recent Labs Lab 11/29/2016 1332  CALCIUM 9.1    CBC  Recent Labs Lab 12/01/2016 1332 11/30/2016 1334  WBC 11.4*  --   HGB 14.3 15.6*  HCT 42.0 46.0  PLT 490*  --     Coag's  Recent Labs Lab 11/03/2016 1332  APTT 27  INR 0.99    Sepsis Markers No results for input(s): LATICACIDVEN,  PROCALCITON, O2SATVEN in the last 168 hours.  ABG No results for input(s): PHART, PCO2ART, PO2ART in the last 168 hours.  Liver Enzymes  Recent Labs Lab 11/11/2016 1332  AST 38  ALT 30  ALKPHOS 69  BILITOT 0.5  ALBUMIN 4.2    Cardiac Enzymes No results for input(s): TROPONINI, PROBNP in the last 168 hours.  Glucose  Recent Labs Lab 11/05/2016 1328  GLUCAP 148*    Imaging Dg Chest Portable 1 View  Result Date: 11/07/2016 CLINICAL DATA:  Post intubation EXAM: PORTABLE CHEST 1 VIEW COMPARISON:  None available FINDINGS: Endotracheal tube 4.1 cm above the carina. NG tube extends into the stomach with the tip not visualized. Mild cardiomegaly with central vascular congestion. Left mid lung and bibasilar atelectasis versus scarring. Trace pleural effusions suspected. No significant focal airspace process, collapse or consolidation. No pneumothorax. Atherosclerosis of aorta. IMPRESSION: Support apparatus in good position Cardiomegaly with mild vascular congestion, bibasilar atelectasis, and trace pleural effusions Electronically Signed   By: Jerilynn Mages.  Shick M.D.   On: 11/13/2016 14:22   Ct Head Code Stroke Wo Contrast  Result Date: 11/29/2016 CLINICAL DATA:  Code stroke. Code stroke. Acute onset of right-sided weakness, slurred speech, and facial droop. EXAM: CT HEAD WITHOUT CONTRAST TECHNIQUE: Contiguous axial images were obtained from the base of the skull through the vertex without intravenous contrast. COMPARISON:  None. FINDINGS: Brain: Acute hemorrhage is focused in the left thalamus and basal ganglia measuring 3.5 x 4.6 x 3.5 cm. The estimated volume is 28 cubic cm intraventricular extension is noted with the lead extending into the left greater than right lateral ventricle, third ventral, and the fourth ventricle. There is dilation of the temporal horns which may reflect early hydrocephalus bilaterally. Surrounding edema is present. Midline shift extends 8 mm from the midline. Vascular: No  significant vascular calcifications are present. Skull: Calvarium is intact. No focal lytic or blastic lesions are present. Sinuses/Orbits: The paranasal sinuses and mastoid air cells are clear. IMPRESSION: 1. Left alignment in basal ganglia hemorrhage with extension into the ventricles. 2. Hemorrhage volume is estimated at 28 cc. 3. Diffuse intra-articular extension with evidence for early hydrocephalus. 4. 8 mm of left-to-right midline shift. These results were called by telephone at the time of interpretation on 11/29/2016 at 1:49 pm to Dr. Leonel Ramsay, who verbally acknowledged these results. Electronically Signed   By: San Morelle M.D.   On: 11/27/2016 13:49     STUDIES:  11/27/2016 CT head left basal ganglia hemorrhage with extension into the ventricles, hemorrhage volume estimated to be 28 mL, diffuse intra-articular extension with evidence for early hydrocephalus 8 mm of left-to-right midline shift  CULTURES: None  ANTIBIOTICS: None  SIGNIFICANT EVENTS: 11/15/2016 admission  LINES/TUBES: 12/01/2016 endotracheal tube  DISCUSSION: 61 year old female with an intracerebral hemorrhage who required intubation for airway protection  ASSESSMENT / PLAN:  PULMONARY  A: Acute respiratory failure with hypoxemia secondary to inability to protect airway P:   Vent bundle Ventilator associated pneumonia prevention Daily wakeup assessment Daily spontaneous breathing trial Continue full ventilatory support for now Check ABG, chest x-ray  CARDIOVASCULAR A:  History of hypertension Intracerebral hemorrhage P:  Hypertension goals and management per neurology  RENAL A:   No acute issues P:   Monitor BMET and UOP Replace electrolytes as needed   GASTROINTESTINAL A:   No acute issues P:   Continue stress ulcer prophylaxis  HEMATOLOGIC A:   No acute issues P:  Monitor for bleeding  INFECTIOUS A:   No acute issues P:   Monitor for fever  ENDOCRINE A:   No  acute issues   P:   Monitor glucose  NEUROLOGIC A:   Acute intracerebral hemorrhage Left-to-right midline shift Acute encephalopathy secondary to acute cerebral hemorrhage P:   RASS goal: 0 to -1 PAD protocol with propofol and intermittent fentanyl   FAMILY  - Updates: I met with the patient's husband today on 11/25/2016  - Inter-disciplinary family meet or Palliative Care meeting due by:  day 7   My critical care time 31 minutes  Roselie Awkward, MD [facility] Pager: [phone] Cell: [phone] After 3pm or if no response, call [phone]    11/20/2016, 4:24 PM

## 2016-11-02 NOTE — Progress Notes (Signed)
SLP Cancellation Note  Patient Details Name: Mariah Flores MRN: [id number] DOB: 07/27/1955   Cancelled treatment:       Reason Eval/Treat Not Completed: Patient not medically ready. Per charting pt intubated. Will f/u for cognitive linguistic evaluation when appropriate.  Deneise Lever, [place], [place] Speech-Language Pathologist [phone]   Aliene Altes 11/05/2016, 4:14 PM

## 2016-11-02 NOTE — ED Notes (Signed)
R pulil 2/ L ppil 3. Both nonreactive.

## 2016-11-02 NOTE — H&P (Signed)
Neurology H&P  CC: Right sided weakness  History is obtained from:Patient   HPI: Mariah Flores is a 61 y.o. female with a history of hypertension who presents with right sided weakness and aphasia. The husband spoke with her around 11:30 am and she was her normal self. He subsequently heard her cry out and went into the room and she was less responsive. He called [phone]. On arrival, she was still following commands, though with  A right hemiparesis.  She had repeated emesis.   LKW: 11:30 am tpa given?: no, ICH ICH Score: 2 Modified Rankin Score: 0  ROS: A 14 point ROS was performed and is negative except as noted in the HPI.   Past Medical History:  Diagnosis Date  . Anxiety   . Blood dyscrasia   . Elevated platelet count ([place])   . History of MRSA infection   . Hyperlipidemia   . Hypertension   . Hypothyroidism   . Kidney stones    15 yrs ago  . Lipoma   . Osteopenia   . Vitamin D deficiency      Family History  Problem Relation Age of Onset  . Kidney cancer Father 64  . Heart disease Father   . Cancer Father        Kidney  . Hyperlipidemia Father   . Heart disease Mother   . Hyperlipidemia Mother      Social History:  reports that she has been smoking Cigarettes.  She has a 40.00 pack-year smoking history. She has never used smokeless tobacco. She reports that she does not drink alcohol or use drugs.   Exam: Current vital signs: Vitals:   11/17/2016 1427 11/08/2016 1428  BP: 119/73   Pulse: 72 73  Resp: 16 16  SpO2: 100% 97%   Vital signs in last 24 hours:    Physical Exam  Constitutional: Appears well-developed and well-nourished.  Psych: Appears concerned.  Eyes: No scleral injection HENT: emesis around her mouth Head: Normocephalic.  Cardiovascular: Normal rate and regular rhythm.  Respiratory: Effort normal and breath sounds normal to anterior ascultation GI: Soft.  No distension. There is no tenderness.  Skin: WDI  Neuro: Mental  Status: Patient is aphasic. She initially follow commands, but subsequently stops following commands.  She does not attend well to the right side. Cranial Nerves: II: R hemianopia. Pupils are equal, round, and reactive to light.   III,IV, VI: does not clearly ross midline to the right.  V: Facial sensation is decreased on the right.  VII: Facial movement with right weakness VIII, X, XI, XII: Unable to assess secondary to patient's altered mental status.  Motor: Tone is normal. Bulk is normal. 5/5 on the left, she has minimal movement in the right leg, no movement right arm.  Sensory: She responds less to nox stim on the right than left.  Cerebellar: Does not perform  I have reviewed labs in epic and the results pertinent to this consultation are: Hypokalemia at 2.6 CMP otherwise unremarkable  I have reviewed the images obtained: CT head - LG hemorrhage.   Impression: 61 yo F with L BG hemorrhage, likely hypertensive in eitology. Due to the extensive IVH, I discussed need for EVD with Dr. Arnoldo Morale of neurosurgery. He has requested a repeat head CT at 8pm with consideration of EVD if worsening hydrocephalus.   Recommendations: 1) Admit to ICU 2) no antiplatelets or anticoagulants 3) blood pressure control with goal systolic 932 - 355 4) Frequent neuro checks 5)  If symptoms worsen or there is decreased mental status, repeat stat head CT 6) PT,OT,ST 7) Potassium replacement.   This patient is critically ill and at significant risk of neurological worsening, death and care requires constant monitoring of vital signs, hemodynamics,respiratory and cardiac monitoring, neurological assessment, discussion with family, other specialists and medical decision making of high complexity. I spent 60 minutes of neurocritical care time  in the care of  this patient.  Roland Rack, MD [facility] [phone]  If 7pm- 7am, please page neurology on call as listed in  [facility]. 11/12/2016  1:46 PM

## 2016-11-02 NOTE — Progress Notes (Signed)
eLink Physician-Brief Progress Note Patient Name: Mariah Flores DOB: 04-30-1955 MRN: [id number]   Date of Service  11/27/2016  HPI/Events of Note  61 year old female with known history of hypertension. Admitted with left-sided hemorrhage and intracerebral. Neurosurgery is aware for possible need for EVD placement. Post intubation chest x-ray shows good tube placement. Propofol Versed and fentanyl ordered for sedation. Ventilator orders in place. Patient preparing for transport for repeat CT head.   eICU Interventions  Continuing plan of care as per admitting service. Intensivist team to assess patient at bedside.      Intervention Category Evaluation Type: New Patient Evaluation  Tera Partridge 11/26/2016, 3:45 PM

## 2016-11-02 NOTE — ED Provider Notes (Signed)
[place] DEPT Provider Note   CSN: [id number] Arrival date & time: 11/29/2016  1328     History   Chief Complaint Chief Complaint  Patient presents with  . Code Stroke    HPI Mariah Flores is a 61 y.o. female.  HPI   Level V caveat due to dysphasia and acuity of condition  Mariah Flores is a 61 y.o. female, with a history of Hypertension, hyperlipidemia, and hypothyroidism, presenting to the ED Via EMS with concern for stroke. Patient last seen normal at 11:30 AM this morning by her husband. EMS reports patient was found by her husband around 12 PM with red tinged vomit on her face and chest. EMS found the patient supine in bed. They note right-sided facial droop, drooling, with flaccid right-sided extremities and blood-tinged vomit in the mouth and on the face. EMS CBG 112 on scene. BP 232/122    Past Medical History:  Diagnosis Date  . Anxiety   . Blood dyscrasia   . Carpal tunnel syndrome   . Elevated platelet count   . History of MRSA infection   . Hyperlipidemia   . Hypertension   . Hypothyroidism   . Kidney stones    15 yrs ago  . Lipoma   . Osteopenia   . Vitamin D deficiency     Patient Active Problem List   Diagnosis Date Noted  . ICH (intracerebral hemorrhage) ([place]) 11/27/2016  . 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 Exertional dyspnea 03/21/2015  . Tobacco use 03/21/2015  . Hypertension 03/08/2015  . Hypothyroidism 03/08/2015  . Osteopenia 03/08/2015  . Thrombocytosis ([place]) 02/20/2015    Past Surgical History:  Procedure Laterality Date  . LIPOMA EXCISION Left 04/05/2015   Procedure: excision submental and supraclavicular lesion left;  Surgeon: Hubbard Robinson, MD;  Location: [facility] ORS;  Service: General;  Laterality: Left;  . TOTAL ABDOMINAL HYSTERECTOMY W/ BILATERAL SALPINGOOPHORECTOMY  2009    OB History    No data available       Home Medications    Prior to Admission medications   Medication Sig Start Date End Date Taking?  Authorizing Provider  aspirin 81 MG tablet Take 81 mg by mouth daily.    [provider]  hydrochlorothiazide (HYDRODIURIL) 25 MG tablet Take 1 tablet by mouth daily. 01/23/15   [provider]  ibandronate (BONIVA) 150 MG tablet Take 1 tablet by mouth every 30 (thirty) days.  01/23/15   [provider]  levothyroxine (SYNTHROID, LEVOTHROID) 88 MCG tablet Take 1 tablet by mouth daily. 01/23/15   [provider]  omeprazole (PRILOSEC) 20 MG capsule Take 20 mg by mouth as needed.    [provider]  PARoxetine (PAXIL) 40 MG tablet Take 1 tablet by mouth every morning.  01/23/15   [provider]  potassium chloride SA (K-DUR,KLOR-CON) 20 MEQ tablet Take 1 tablet (20 mEq total) by mouth 2 (two) times daily. 03/31/15 04/04/15  Hubbard Robinson, MD  simvastatin (ZOCOR) 20 MG tablet Take 1 tablet by mouth daily. 01/23/15   [provider]  telmisartan (MICARDIS) 40 MG tablet Take 1 tablet by mouth every morning.  02/15/15   [provider]    Family History Family History  Problem Relation Age of Onset  . Kidney cancer Father 86  . Heart disease Father   . Cancer Father        Kidney  . Hyperlipidemia Father   . Heart disease Mother   . Hyperlipidemia Mother  Social History Social History  Substance Use Topics  . Smoking status: Current Every Day Smoker    Packs/day: 1.00    Years: 40.00    Types: Cigarettes  . Smokeless tobacco: Never Used  . Alcohol use No     Allergies   Patient has no known allergies.   Review of Systems Review of Systems  Unable to perform ROS: Acuity of condition     Physical Exam Updated Vital Signs BP (!) 217/128   Pulse 77   Resp 16   Ht 5\' 3"  (1.6 m)   Wt 70 kg (154 lb 5.2 oz) Comment: Estimated  SpO2 100%   BMI 27.34 kg/m   Physical Exam  Constitutional: She appears well-developed and well-nourished. No distress.  HENT:  Head: Normocephalic and atraumatic.    Mouth/Throat: Oropharynx is clear and moist.  Patient's airway appears to be clear at first patient contact.  Eyes: Conjunctivae are normal.  Neck: Neck supple.  Cardiovascular: Normal rate, regular rhythm and intact distal pulses.   Pulmonary/Chest: She has rhonchi.  No noted increased work of breathing.  Abdominal: Soft.  Musculoskeletal: She exhibits no edema.  Lymphadenopathy:    She has no cervical adenopathy.  Neurological: She is alert.  Patient does not follow commands. Her speech is garbled. Her eyes are open spontaneously. Right side upper and lower extremity without muscle tone. Apparent right-sided facial droop.  The above is the extent of the exam that I was able to obtain prior to patient's decrease in level of consciousness and subsequent intubation.  Skin: Skin is warm and dry. Capillary refill takes less than 2 seconds. She is not diaphoretic.  Psychiatric: She has a normal mood and affect. Her behavior is normal.  Nursing note and vitals reviewed.    ED Treatments / Results  Labs (all labs ordered are listed, but only abnormal results are displayed) Labs Reviewed  CBC - Abnormal; Notable for the following:       Result Value   WBC 11.4 (*)    RBC 5.26 (*)    Platelets 490 (*)    All other components within normal limits  COMPREHENSIVE METABOLIC PANEL - Abnormal; Notable for the following:    Potassium 2.6 (*)    Chloride 98 (*)    Glucose, Bld 155 (*)    All other components within normal limits  CBG MONITORING, ED - Abnormal; Notable for the following:    Glucose-Capillary 148 (*)    All other components within normal limits  I-STAT CHEM 8, ED - Abnormal; Notable for the following:    Potassium 2.5 (*)    Chloride 97 (*)    Glucose, Bld 152 (*)    Calcium, Ion 1.04 (*)    Hemoglobin 15.6 (*)    All other components within normal limits  ETHANOL  PROTIME-INR  APTT  DIFFERENTIAL  TRIGLYCERIDES  RAPID URINE DRUG SCREEN, HOSP PERFORMED   URINALYSIS, ROUTINE W REFLEX MICROSCOPIC  I-STAT TROPONIN, ED    EKG  EKG Interpretation None       Radiology Dg Chest Portable 1 View  Result Date: 11/09/2016 CLINICAL DATA:  Post intubation EXAM: PORTABLE CHEST 1 VIEW COMPARISON:  None available FINDINGS: Endotracheal tube 4.1 cm above the carina. NG tube extends into the stomach with the tip not visualized. Mild cardiomegaly with central vascular congestion. Left mid lung and bibasilar atelectasis versus scarring. Trace pleural effusions suspected. No significant focal airspace process, collapse or consolidation. No pneumothorax. Atherosclerosis of aorta. IMPRESSION: Support  apparatus in good position Cardiomegaly with mild vascular congestion, bibasilar atelectasis, and trace pleural effusions Electronically Signed   By: Jerilynn Mages.  Shick M.D.   On: 11/23/2016 14:22   Ct Head Code Stroke Wo Contrast  Result Date: 11/11/2016 CLINICAL DATA:  Code stroke. Code stroke. Acute onset of right-sided weakness, slurred speech, and facial droop. EXAM: CT HEAD WITHOUT CONTRAST TECHNIQUE: Contiguous axial images were obtained from the base of the skull through the vertex without intravenous contrast. COMPARISON:  None. FINDINGS: Brain: Acute hemorrhage is focused in the left thalamus and basal ganglia measuring 3.5 x 4.6 x 3.5 cm. The estimated volume is 28 cubic cm intraventricular extension is noted with the lead extending into the left greater than right lateral ventricle, third ventral, and the fourth ventricle. There is dilation of the temporal horns which may reflect early hydrocephalus bilaterally. Surrounding edema is present. Midline shift extends 8 mm from the midline. Vascular: No significant vascular calcifications are present. Skull: Calvarium is intact. No focal lytic or blastic lesions are present. Sinuses/Orbits: The paranasal sinuses and mastoid air cells are clear. IMPRESSION: 1. Left alignment in basal ganglia hemorrhage with extension into the  ventricles. 2. Hemorrhage volume is estimated at 28 cc. 3. Diffuse intra-articular extension with evidence for early hydrocephalus. 4. 8 mm of left-to-right midline shift. These results were called by telephone at the time of interpretation on 11/28/2016 at 1:49 pm to Dr. Leonel Ramsay, who verbally acknowledged these results. Electronically Signed   By: San Morelle M.D.   On: 12/01/2016 13:49    Procedures Procedures (including critical care time)  CRITICAL CARE Performed by: Verley Pariseau C Edman Lipsey Total critical care time: 35 minutes Critical care time was exclusive of separately billable procedures and treating other patients. Critical care was necessary to treat or prevent imminent or life-threatening deterioration. Critical care was time spent personally by me on the following activities: development of treatment plan with patient and/or surrogate as well as nursing, discussions with consultants, evaluation of patient's response to treatment, examination of patient, obtaining history from patient or surrogate, ordering and performing treatments and interventions, ordering and review of laboratory studies, ordering and review of radiographic studies, pulse oximetry and re-evaluation of patient's condition.  Medications Ordered in ED Medications  ondansetron (ZOFRAN) 4 MG/2ML injection (not administered)  propofol (DIPRIVAN) 1000 MG/100ML infusion (40 mcg/kg/min  70 kg Intravenous Rate/Dose Change 11/13/2016 1429)  potassium chloride 10 mEq in 100 mL IVPB (not administered)  ondansetron (ZOFRAN) injection 4 mg (4 mg Intravenous Given 11/20/2016 1345)  ondansetron (ZOFRAN) injection 4 mg (4 mg Intravenous Given 11/08/2016 1339)  niCARdipine in saline (CARDENE-IV) 20-0.86 MG/200ML-% infusion SOLN (2.5 mg/hr  Rate/Dose Change 12/01/2016 1449)  labetalol (NORMODYNE,TRANDATE) injection 20 mg (20 mg Intravenous Given 11/15/2016 1348)  etomidate (AMIDATE) injection (20 mg Intravenous Given 11/28/2016 1352)  rocuronium  (ZEMURON) injection (70 mg Intravenous Given 11/05/2016 1353)     Initial Impression / Assessment and Plan / ED Course  I have reviewed the triage vital signs and the nursing notes.  Pertinent labs & imaging results that were available during my care of the patient were reviewed by me and considered in my medical decision making (see chart for details).  Clinical Course as of Nov 02 1501  Sat Nov 02, 2016  1350 Patient noted to have decreasing level of consciousness. Pupils are now about 2 mm on the right and 3 mm on the left. Both nonreactive.  [SJ]  74 Spoke with Dr. Arnoldo Morale, neurosurgery, who states patient is  to be admitted to neuro ICU and have nonsurgical management at this time. Dr. Arnoldo Morale was put in contact with Dr. Leonel Ramsay, per Dr. Cecil Cobbs request.  [SJ]    Clinical Course User Index [SJ] Gorden Stthomas, Helane Gunther, PA-C    Patient presents with right sided strokelike symptoms. Intracranial hemorrhage noted on CT. Intubated for airway protection. Admission to neuro ICU.  Findings and plan of care discussed with Antony Blackbird, MD. Dr. Sherry Ruffing personally evaluated and examined this patient.  Vitals:   11/16/2016 1427 11/29/2016 1428 11/07/2016 1430 11/11/2016 1445  BP: 119/73  119/72 115/71  Pulse: 72 73 72 71  Resp: 16 16 16 16   SpO2: 100% 97% 98% 100%  Weight:      Height:         Final Clinical Impressions(s) / ED Diagnoses   Final diagnoses:  Nontraumatic acute hemorrhage of basal ganglia [facility])    New Prescriptions Current Discharge Medication List       Layla Maw 11/26/2016 1506    Tegeler, Gwenyth Allegra, MD 11/05/16 1120

## 2016-11-02 NOTE — ED Notes (Signed)
Dr Leonel Ramsay speaking with husband.

## 2016-11-02 NOTE — Progress Notes (Signed)
[place] Progress Note Patient Name: Mariah Flores DOB: 06-24-1955 MRN: [id number]   Date of Service  11/26/2016  HPI/Events of Note  Admission labs and portable chest x-ray reviewed. Endotracheal tube in good position. Enteric feeding tube coursing low diaphragm. Normal renal function with serum potassium 2.6. Normal serum bicarbonate.   eICU Interventions  1. Stat serum magnesium 2. KCl via to 40 mEq 3. Continuing with previously ordered potassium chloride runs 4. Electrolyte panel ordered with a.m. labs      Intervention Category Major Interventions: Electrolyte abnormality - evaluation and management  Tera Partridge 11/22/2016, 3:54 PM

## 2016-11-02 NOTE — Code Documentation (Signed)
61 y.o. female with stroke risk factors of HLD and HTN who was stated to be in her normal state of health this morning when she had an acute onset of right sided weakness, right sided facial droop, AMS and dysarthria. EMS was notified and called a code stroke. EMS reporting SBP's of 210 and above, tachycardia and 2 episodes of emesis.  On arrival to [facility] ED, patient was met at the bridge by the stroke team, labs were drawn, airway cleared and patient taken to CT.  While waiting for the CT to begin, patient had 1 episode of emesis for which Zofran was given. Initial assessment, patient able to follow intermittent commands, right sided facial droop, dense right sided hemiparesis, aphasic and dysarthric. NIHSS 20. See EMR for NIHSS and code stroke times.   CT revealed left alignment in basal ganglia hemorrhage with extension into the ventricles. Hemorrhage volume is estimated at 28 cc. Diffuse intra-articular extension with evidence for early hydrocephalus. 8 mm of left-to-right midline shift. After the CT, patient's mental status started to decrease and patient was rushed back to the ED. Decision to intubate for airway protection made. While intubation supplies were being collected, patient had 1 more episode of emesis for which a second dose of Zofran was administered. BP above parameters for ICH. 20 mg labetalol given. Reassessment NIHSS 28. Patient intubated by EDP. Cardene and propofol gtt started and titrated appropriately. ED bedside handoff with ED RN Hassan Rowan.

## 2016-11-02 NOTE — ED Notes (Signed)
Pt continuing to vomit and responsiveness is decreased.  Friend at bedside.  Preparing to intubate.

## 2016-11-02 NOTE — Progress Notes (Signed)
Patient transported to CT and back to room 7H21 without complications.

## 2016-11-02 NOTE — Progress Notes (Signed)
While responding to another [facility] page Chaplain was directed to pt.'s room where best friend also named Natelie was standing. Chaplain was later introduced to husband. Chaplain spoke with husband right after husband received prognosis of imminent decline from Dr. Olam Idler was in a bit of shock. Husband asked for prayers. Chaplain offered ministry of presence and comfort and offered prayers

## 2016-11-03 ENCOUNTER — Inpatient Hospital Stay (HOSPITAL_COMMUNITY): Payer: Self-pay

## 2016-11-03 DIAGNOSIS — J9600 Acute respiratory failure, unspecified whether with hypoxia or hypercapnia: Secondary | ICD-10-CM

## 2016-11-03 LAB — COMPREHENSIVE METABOLIC PANEL
ALT: 30 U/L (ref 14–54)
AST: 38 U/L (ref 15–41)
Albumin: 4.2 g/dL (ref 3.5–5.0)
Alkaline Phosphatase: 69 U/L (ref 38–126)
Anion gap: 14 (ref 5–15)
BUN: 10 mg/dL (ref 6–20)
CHLORIDE: 98 mmol/L — AB (ref 101–111)
CO2: 24 mmol/L (ref 22–32)
CREATININE: 0.74 mg/dL (ref 0.44–1.00)
Calcium: 9.1 mg/dL (ref 8.9–10.3)
GFR calc Af Amer: >60 mL/min (ref >60)
GLUCOSE: 155 mg/dL — AB (ref 65–99)
Potassium: 2.6 mmol/L — CL (ref 3.5–5.1)
Sodium: 136 mmol/L (ref 135–145)
Total Bilirubin: 0.5 mg/dL (ref 0.3–1.2)
Total Protein: 7.5 g/dL (ref 6.5–8.1)

## 2016-11-03 LAB — MAGNESIUM: MAGNESIUM: 1.9 mg/dL (ref 1.7–2.4)

## 2016-11-03 LAB — CBC WITH DIFFERENTIAL/PLATELET
BASOS PCT: 0 %
Basophils Absolute: 0 10*3/uL (ref 0.0–0.1)
EOS ABS: 0.1 10*3/uL (ref 0.0–0.7)
Eosinophils Relative: 1 %
HEMATOCRIT: 41.3 % (ref 36.0–46.0)
Hemoglobin: 13.8 g/dL (ref 12.0–15.0)
Lymphocytes Relative: 9 %
Lymphs Abs: 1.4 10*3/uL (ref 0.7–4.0)
MCH: 27 pg (ref 26.0–34.0)
MCHC: 33.4 g/dL (ref 30.0–36.0)
MCV: 80.8 fL (ref 78.0–100.0)
MONO ABS: 1.7 10*3/uL — AB (ref 0.1–1.0)
MONOS PCT: 11 %
Neutro Abs: 12.2 10*3/uL — ABNORMAL HIGH (ref 1.7–7.7)
Neutrophils Relative %: 79 %
Platelets: 423 10*3/uL — ABNORMAL HIGH (ref 150–400)
RBC: 5.11 MIL/uL (ref 3.87–5.11)
RDW: 14.1 % (ref 11.5–15.5)
WBC: 15.4 10*3/uL — ABNORMAL HIGH (ref 4.0–10.5)

## 2016-11-03 LAB — RENAL FUNCTION PANEL
Albumin: 3.6 g/dL (ref 3.5–5.0)
Anion gap: 11 (ref 5–15)
BUN: 8 mg/dL (ref 6–20)
CALCIUM: 8.6 mg/dL — AB (ref 8.9–10.3)
CO2: 28 mmol/L (ref 22–32)
CREATININE: 0.68 mg/dL (ref 0.44–1.00)
Chloride: 102 mmol/L (ref 101–111)
GFR calc Af Amer: >60 mL/min (ref >60)
GFR calc non Af Amer: >60 mL/min (ref >60)
GLUCOSE: 93 mg/dL (ref 65–99)
Phosphorus: 2.8 mg/dL (ref 2.5–4.6)
Potassium: 3.6 mmol/L (ref 3.5–5.1)
SODIUM: 141 mmol/L (ref 135–145)

## 2016-11-03 LAB — GLUCOSE, CAPILLARY: GLUCOSE-CAPILLARY: 116 mg/dL — AB (ref 65–99)

## 2016-11-03 LAB — HIV ANTIBODY (ROUTINE TESTING W REFLEX): HIV SCREEN 4TH GENERATION: NONREACTIVE

## 2016-11-03 NOTE — Progress Notes (Signed)
RT transported pt to and from CT without event. RT will continue to monitor. 

## 2016-11-03 NOTE — Progress Notes (Signed)
PT Cancellation Note  Patient Details Name: Mariah Flores MRN: [id number] DOB: 1955-10-20   Cancelled Treatment:    Reason Eval/Treat Not Completed: Patient not medically ready   Currently on Bedrest activity orders, mechanically ventilated;   Will follow for appropriateness of PT eval;   Thank you,  Roney Marion, [place] Pager [phone] Office [phone]    Colletta [place] 11/03/2016, 7:22 AM

## 2016-11-03 NOTE — Progress Notes (Addendum)
PULMONARY / CRITICAL CARE MEDICINE   Name: Mariah Flores MRN: [id number] DOB: [birth year]    ADMISSION DATE:  11/25/2016 CONSULTATION DATE:  11/16/2016  REFERRING MD:  Dr. Leonel Ramsay  CHIEF COMPLAINT:  Headache, nausea or vomiting  HISTORY OF PRESENT ILLNESS:   61 year old female with a past medical history significant for hypertension was brought to the emergency department by emergency medical services after her husband noted the sudden onset of nausea vomiting, slurred speech. She had been in her usual state of health until this morning when the symptoms developed suddenly. She was intubated on my exam and cannot provide history. EMS brought her to the emergency department where she initially was able to speak but her mental status worsened so she was intubated. She was found to have an intracerebral hemorrhage. Neurosurgery is evaluating her. Pulmonary and critical care medicine was consulted for ventilator management.  PAST MEDICAL HISTORY :  She  has a past medical history of Anxiety; Blood dyscrasia; Carpal tunnel syndrome; Elevated platelet count; History of MRSA infection; Hyperlipidemia; Hypertension; Hypothyroidism; Kidney stones; Lipoma; Osteopenia; and Vitamin D deficiency.  PAST SURGICAL HISTORY: She  has a past surgical history that includes Total abdominal hysterectomy w/ bilateral salpingoophorectomy (2009) and Lipoma excision (Left, 04/05/2015).  No Known Allergies  No current facility-administered medications on file prior to encounter.    Current Outpatient Prescriptions on File Prior to Encounter  Medication Sig  . aspirin 81 MG tablet Take 81 mg by mouth daily.  . hydrochlorothiazide (HYDRODIURIL) 25 MG tablet Take 1 tablet by mouth daily.  Marland Kitchen ibandronate (BONIVA) 150 MG tablet Take 1 tablet by mouth every 30 (thirty) days.   Marland Kitchen levothyroxine (SYNTHROID, LEVOTHROID) 88 MCG tablet Take 1 tablet by mouth daily.  Marland Kitchen omeprazole (PRILOSEC) 20 MG capsule Take 20 mg by mouth  as needed.  Marland Kitchen PARoxetine (PAXIL) 40 MG tablet Take 1 tablet by mouth every morning.   . potassium chloride SA (K-DUR,KLOR-CON) 20 MEQ tablet Take 1 tablet (20 mEq total) by mouth 2 (two) times daily.  . simvastatin (ZOCOR) 20 MG tablet Take 1 tablet by mouth daily.  Marland Kitchen telmisartan (MICARDIS) 40 MG tablet Take 1 tablet by mouth every morning.     FAMILY HISTORY:  Her indicated that the status of her mother is unknown. She indicated that the status of her father is unknown.    SOCIAL HISTORY: She  reports that she has been smoking Cigarettes.  She has a 40.00 pack-year smoking history. She has never used smokeless tobacco. She reports that she does not drink alcohol or use drugs.  REVIEW OF SYSTEMS:   Cannot obtain due to intubation  SUBJECTIVE:  Remains on vent Repeat CT today does not show any changes in IVH compared to yestarday  VITAL SIGNS: BP 118/65   Pulse 69   Temp 98.9 F (37.2 C) (Axillary)   Resp 16   Ht 5\' 3"  (1.6 m)   Wt 146 lb 6.2 oz (66.4 kg)   SpO2 100%   BMI 25.93 kg/m   HEMODYNAMICS:    VENTILATOR SETTINGS: Vent Mode: PRVC FiO2 (%):  [30 %-100 %] 30 % Set Rate:  [16 bmp] 16 bmp Vt Set:  [420 mL] 420 mL PEEP:  [5 cmH20] 5 cmH20 Plateau Pressure:  [16 cmH20-20 cmH20] 18 cmH20  INTAKE / OUTPUT: I/O last 3 completed shifts: In: 1169.4 [I.V.:769.4; IV Piggyback:400] Out: 2575 [Urine:2575]  PHYSICAL EXAMINATION: Gen:      No acute distress HEENT:  EOMI, sclera anicteric, ETT  in place Neck:     No masses; no thyromegaly Lungs:    Clear to auscultation bilaterally; normal respiratory effort CV:         Regular rate and rhythm; no murmurs Abd:      + bowel sounds; soft, non-tender; no palpable masses, no distension Ext:    No edema; adequate peripheral perfusion Skin:      Warm and dry; no rash Neuro: Sedated, unresponsive   LABS:  BMET  Recent Labs Lab 11/22/2016 1332 11/17/2016 1334 11/03/16 0224  NA 136 137 141  K 2.6* 2.5* 3.6  CL 98* 97*  102  CO2 24  --  28  BUN 10 13 8   CREATININE 0.74 0.60 0.68  GLUCOSE 155* 152* 93    Electrolytes  Recent Labs Lab 11/05/2016 1332 11/12/2016 1630 11/03/16 0224  CALCIUM 9.1  --  8.6*  MG  --  1.9 1.9  PHOS  --   --  2.8    CBC  Recent Labs Lab 11/19/2016 1332 11/29/2016 1334 11/03/16 0224  WBC 11.4*  --  15.4*  HGB 14.3 15.6* 13.8  HCT 42.0 46.0 41.3  PLT 490*  --  423*    Coag's  Recent Labs Lab 11/04/2016 1332  APTT 27  INR 0.99    Sepsis Markers No results for input(s): LATICACIDVEN, PROCALCITON, O2SATVEN in the last 168 hours.  ABG  Recent Labs Lab 11/29/2016 1700  PHART 7.342*  PCO2ART 55.1*  PO2ART 463*    Liver Enzymes  Recent Labs Lab 11/05/2016 1332 11/03/16 0224  AST 38  --   ALT 30  --   ALKPHOS 69  --   BILITOT 0.5  --   ALBUMIN 4.2 3.6    Cardiac Enzymes No results for input(s): TROPONINI, PROBNP in the last 168 hours.  Glucose  Recent Labs Lab 11/08/2016 1328 11/03/16 0727  GLUCAP 148* 116*     STUDIES:  11/30/2016 CT head left basal ganglia hemorrhage with extension into the ventricles, hemorrhage volume estimated to be 28 mL, diffuse intra-articular extension with evidence for early hydrocephalus 8 mm of left-to-right midline shift  11/03/2016 CT head- Stable hemorrhage. no acute changes compared to 9/1  11/30/2016 Chest x-ray-ET tube in appropriate position, mild basilar atelectasis and small effusion.  CULTURES: None  ANTIBIOTICS: None  SIGNIFICANT EVENTS: 11/20/2016 admission  LINES/TUBES: 11/18/2016 endotracheal tube  DISCUSSION: 61 year old female with an intracerebral hemorrhage who required intubation for airway protection  ASSESSMENT / PLAN:  PULMONARY A: Acute respiratory failure with hypoxemia secondary to inability to protect airway P:   Continue vent support SBTs as allowed by mental status Wean sedation down  CARDIOVASCULAR A:  History of hypertension Intracerebral hemorrhage P:   Management per neurology   RENAL A:   No acute issues P:   Monitor urine output and lytes  GASTROINTESTINAL A:   No acute issues P:   Stress ulcer prophylaxis   HEMATOLOGIC A:   No acute issues P:  Monitor for bleeding  INFECTIOUS A:   No acute issues P:   Monitor for fever Observe off antibiotic  ENDOCRINE A:   No acute issues   P:   Monitor glucose  NEUROLOGIC A:   Acute intracerebral hemorrhage Left-to-right midline shift Acute encephalopathy secondary to acute cerebral hemorrhage P:   RASS goal: 0 to -1 PAD protocol with propofol and intermittent fentanyl  FAMILY  - Updates: Patient's husband on 11/27/2016 - Inter-disciplinary family meet or Palliative Care meeting due by:  day 7  The patient is critically ill with multiple organ system failure and requires high complexity decision making for assessment and support, frequent evaluation and titration of therapies, advanced monitoring, review of radiographic studies and interpretation of complex data.   Critical Care Time devoted to patient care services, exclusive of separately billable procedures, described in this note is 35 minutes.   Marshell Garfinkel MD [facility] Pager [phone] If no answer or after 3pm call: [phone] 11/03/2016, 7:49 AM

## 2016-11-03 NOTE — Consult Note (Signed)
Date of consult: 11/03/2016 Requester consult: Dr. Leonie Man Reason for request: Large intracerebral hemorrhage with intraventricular extension History of present illness: Mariah Flores is a 61 year old individual who presented with an acute intracerebral hemorrhage. This is a left-sided hemorrhage intraventricular extension and obliteration ofher basal ganglia and thalamus. There is moderate mass effect. The volume of the clot measures 42 mL. The patient has had progressive neurologic tear aeration requiring intubation.At the current time she opens her eyes and is alert and moves her left side well. The right upper extremity has some trace movement but she will not follow commands with it.  Past medical history is recorded in the chart and was noted.  Physical exam:The patient is arousable to voice. She is demonstrating increased tone in her right upper extremity and has some trace movement but is not able to follow commands with it. She moves her left upper extremity and left lower extremity very purposefully. She has a right facial. Her pupils are 3 mm and equally reactive to light and accommodation. Extraocular movements appear full.  Impression: Patient has a large intracerebral hemorrhage with intraventricular extension. At the current time there is no evidence of hydrocephalus. I do not favor craniotomy for evacuation as this will not improve the patient's neurologic status. I discussed the situation with the patient's husband and daughters that were present. We will continue to monitor in case she develops hydrocephalus but even then one may be cautious with how aggressive the treatment they desire.

## 2016-11-03 NOTE — Progress Notes (Signed)
OT Cancellation Note  Patient Details Name: Mariah Flores MRN: [id number] DOB: 1955/09/16   Cancelled Treatment:    Reason Eval/Treat Not Completed: Patient not medically ready. Pt with active bedrest orders. Will await increase in activity orders prior to initiating OT evaluation. Thank you for this referral!  Norman Herrlich, MS OTR/L  Pager: [place] 11/03/2016, 6:43 AM

## 2016-11-03 NOTE — Progress Notes (Signed)
STROKE TEAM PROGRESS NOTE   HISTORY OF PRESENT ILLNESS (per record) Mariah Flores is a 61 y.o. female with a history of hypertension who presents with right sided weakness and aphasia. The husband spoke with her around 11:30 am and she was her normal self. He subsequently heard her cry out and went into the room and she was less responsive. He called 911. On arrival, she was still following commands, though with  A right hemiparesis.  She had repeated emesis.   LKW: 11:30 am tpa given?: no, ICH ICH Score: 2 Modified Rankin Score: 0  Patient was not administered IV t-PA secondary to [place]. She was admitted to the neuro ICU for further evaluation and treatment.   SUBJECTIVE (INTERVAL HISTORY) Her husband and daughter  were at the bedside.  Propofol gtt was stopped, more awake   OBJECTIVE Temp:  [97.9 F (36.6 C)-98.9 F (37.2 C)] 98.9 F (37.2 C) (09/02 0400) Pulse Rate:  [64-102] 102 (09/02 1205) Cardiac Rhythm: Normal sinus rhythm (09/02 0800) Resp:  [14-28] 27 (09/02 1205) BP: (85-217)/(45-128) 126/74 (09/02 1205) SpO2:  [97 %-100 %] 100 % (09/02 1205) FiO2 (%):  [30 %-100 %] 30 % (09/02 1205) Weight:  [146 lb 6.2 oz (66.4 kg)] 146 lb 6.2 oz (66.4 kg) (09/01 1508)    Physical Exam   Vitals:   11/03/16 1130 11/03/16 1145 11/03/16 1200 11/03/16 1205  BP: 128/69 130/83 126/74 126/74  Pulse: 79 85 78 (!) 102  Resp: (!) 22 (!) 22 (!) 21 (!) 27  Temp:      TempSrc:      SpO2: 98% 100% 100% 100%  Weight:      Height:        Constitutional: Appears well-developed and well-nourished.   Cardiovascular: Normal rate and regular rhythm.  Respiratory: Effort normal and breath sounds normal to anterior ascultation GI: Soft.  No distension. There is no tenderness.  Skin: WDI  Neuro: Mental Status: Patient is somnolent but arousable to stimuli, opens her eyes but  not following commands Cranial Nerves: II: R hemianopia. Pupils are equal, round, and reactive to light.    III,IV, VI: does not clearly ross midline to the right.  V: Facial sensation is decreased on the right.  VII: Facial movement with right weakness  Motor: moves all 4 extremities  Sensory: She responds less to nox stim on the right than left.   CBC:   Recent Labs Lab 11/30/2016 1332 11/20/2016 1334 11/03/16 0224  WBC 11.4*  --  15.4*  NEUTROABS 7.3  --  12.2*  HGB 14.3 15.6* 13.8  HCT 42.0 46.0 41.3  MCV 79.8  --  80.8  PLT 490*  --  423*    Basic Metabolic Panel:   Recent Labs Lab 11/16/2016 1332 11/07/2016 1334 11/24/2016 1630 11/03/16 0224  NA 136 137  --  141  K 2.6* 2.5*  --  3.6  CL 98* 97*  --  102  CO2 24  --   --  28  GLUCOSE 155* 152*  --  93  BUN 10 13  --  8  CREATININE 0.74 0.60  --  0.68  CALCIUM 9.1  --   --  8.6*  MG  --   --  1.9 1.9  PHOS  --   --   --  2.8    Lipid Panel:     Component Value Date/Time   TRIG 132 11/10/2016 1631   HgbA1c: No results found for: HGBA1C Urine Drug  Screen:     Component Value Date/Time   LABOPIA NONE DETECTED 11/03/2016 1535   COCAINSCRNUR NONE DETECTED 11/12/2016 1535   LABBENZ NONE DETECTED 11/17/2016 1535   AMPHETMU NONE DETECTED 11/14/2016 1535   THCU NONE DETECTED 11/15/2016 1535   LABBARB NONE DETECTED 11/10/2016 1535    Alcohol Level     Component Value Date/Time   ETH <5 11/07/2016 1332    IMAGING  Ct Head Wo Contrast 11/03/2016 1. Stable acute hemorrhage (42 cc) centered in left basal ganglia. Stable intraventricular hemorrhage. Stable mild hydrocephalus and 7 mm left-to-right midline shift.  2. No new acute intracranial hemorrhage, acute infarct, or focal mass effect identified.    Ct Head Wo Contrast 11/29/2016 1. Stable acute hemorrhage (42 cc) within the left basal ganglia and intraventricular hemorrhage.  2. Stable mild dilatation of ventricles and left-to-right midline shift of 8 mm. No downward herniation.  3. No new intracranial hemorrhage or infarct identified.    Ct Head Wo  Contrast 11/08/2016   1. Very mild enlargement of the intra-axial hyperdense hemorrhage since 1342 hours today; estimated blood volume now 42 mL versus 31 mL earlier.  2. Regional mass effect and rightward midline shift of 8 mm are unchanged.  3. Intraventricular extension of hemorrhage, IVH blood volume and ventricle size are also stable.  4. No new intracranial abnormality.    Ct Head Code Stroke Wo Contrast 11/10/2016 1. Left alignment in basal ganglia hemorrhage with extension into the ventricles.  2. Hemorrhage volume is estimated at 28 cc.  3. Diffuse intra-articular extension with evidence for early hydrocephalus.  4. 8 mm of left-to-right midline shift.    Dg Chest Portable 1 View 11/27/2016 Support apparatus in good position Cardiomegaly with mild vascular congestion, bibasilar atelectasis, and trace pleural effusions       ASSESSMENT/PLAN [name] is a 61 y.o. female with history of hypertension, hypothyroidism, hyperlipidemia, tobacco use, blood dyscrasia, and anxiety, presenting with right-sided weakness and aphasia. She did not receive IV t-PA due to [place].  Intracranial hemorrhage:  Left basal ganglia.  Resultant    CT head - Stable acute hemorrhage (42 cc) centered in left basal ganglia. Stable intraventricular hemorrhage. Stable mild hydrocephalus and 7 mm left-to-right midline shift.   MRI head - not performed  MRA head - not performed  Carotid Doppler - not indicated  2D Echo - not indicated  LDL - will order - pending  HgbA1c - will order - pending  VTE prophylaxis - SCDs Diet NPO time specified  aspirin 81 mg daily prior to admission, now on No antithrombotic  Ongoing aggressive stroke risk factor management  Therapy recommendations: pending  Disposition:  Pending  Hypertension  Stable (IV Cardene)  SBP goal < 140  Long-term BP goal normotensive  Hyperlipidemia  Home meds:  Zocor 20 mg daily not resumed in hospital  LDL pending,  goal < 70  Continue statin at discharge    Other Stroke Risk Factors  Advanced age  Cigarette smoker - will be advised to stop smoking   Other Active Problems  Leukocytosis - afebrile  Hypokalemia - supplemented and resolved  Hospital day # 1    To contact Stroke Continuity provider, please refer to [email or web address] After hours, contact General Neurology

## 2016-11-04 DIAGNOSIS — G919 Hydrocephalus, unspecified: Secondary | ICD-10-CM

## 2016-11-04 DIAGNOSIS — I618 Other nontraumatic intracerebral hemorrhage: Secondary | ICD-10-CM

## 2016-11-04 DIAGNOSIS — G935 Compression of brain: Secondary | ICD-10-CM

## 2016-11-04 LAB — SODIUM
SODIUM: 141 mmol/L (ref 135–145)
Sodium: 140 mmol/L (ref 135–145)
Sodium: 143 mmol/L (ref 135–145)

## 2016-11-04 LAB — LIPID PANEL
CHOL/HDL RATIO: 4 ratio
CHOLESTEROL: 200 mg/dL (ref 0–200)
HDL: 50 mg/dL (ref >40)
LDL Cholesterol: 123 mg/dL — ABNORMAL HIGH (ref 0–99)
TRIGLYCERIDES: 134 mg/dL (ref <150)
VLDL: 27 mg/dL (ref 0–40)

## 2016-11-04 LAB — GLUCOSE, CAPILLARY
GLUCOSE-CAPILLARY: 35 mg/dL — AB (ref 65–99)
GLUCOSE-CAPILLARY: 105 mg/dL — AB (ref 65–99)
GLUCOSE-CAPILLARY: 67 mg/dL (ref 65–99)
GLUCOSE-CAPILLARY: 57 mg/dL — AB (ref 65–99)
Glucose-Capillary: 138 mg/dL — ABNORMAL HIGH (ref 65–99)
Glucose-Capillary: 121 mg/dL — ABNORMAL HIGH (ref 65–99)

## 2016-11-04 LAB — PHOSPHORUS
PHOSPHORUS: 2.1 mg/dL — AB (ref 2.5–4.6)
Phosphorus: 3 mg/dL (ref 2.5–4.6)

## 2016-11-04 LAB — HEMOGLOBIN A1C
Hgb A1c MFr Bld: 5.9 % — ABNORMAL HIGH (ref 4.8–5.6)
MEAN PLASMA GLUCOSE: 122.63 mg/dL

## 2016-11-04 LAB — MAGNESIUM
Magnesium: 2.4 mg/dL (ref 1.7–2.4)
Magnesium: 2.2 mg/dL (ref 1.7–2.4)

## 2016-11-04 MED ORDER — SODIUM CHLORIDE 3 % IV SOLN
INTRAVENOUS | Status: DC
  Administered 2016-11-04 – 2016-11-05 (×3): 50 mL/h via INTRAVENOUS
  Filled 2016-11-04 (×4): qty 500

## 2016-11-04 MED ORDER — HYDROCHLOROTHIAZIDE 25 MG PO TABS
25.0000 mg | ORAL_TABLET | Freq: Every day | ORAL | Status: DC
  Administered 2016-11-04 – 2016-11-05 (×2): 25 mg
  Filled 2016-11-04 (×2): qty 1

## 2016-11-04 MED ORDER — HYDROCHLOROTHIAZIDE 25 MG PO TABS: 25.0000 mg | ORAL_TABLET | Freq: Every day | ORAL | Status: DC

## 2016-11-04 MED ORDER — IRBESARTAN 150 MG PO TABS
150.0000 mg | ORAL_TABLET | Freq: Every day | ORAL | Status: DC
  Administered 2016-11-04 – 2016-11-05 (×2): 150 mg
  Filled 2016-11-04 (×2): qty 1

## 2016-11-04 MED ORDER — DEXTROSE 50 % IV SOLN
INTRAVENOUS | Status: AC
  Administered 2016-11-04: 50 mL
  Filled 2016-11-04: qty 50

## 2016-11-04 MED ORDER — IRBESARTAN 150 MG PO TABS: 150.0000 mg | ORAL_TABLET | Freq: Every day | ORAL | Status: DC

## 2016-11-04 MED ORDER — VITAL HIGH PROTEIN PO LIQD
1000.0000 mL | ORAL | Status: DC
  Administered 2016-11-04 (×2)
  Administered 2016-11-04 – 2016-11-05 (×2): 1000 mL

## 2016-11-04 NOTE — Progress Notes (Signed)
PT Cancellation Note  Patient Details Name: Mariah Flores MRN: [id number] DOB: 30-Mar-1955   Cancelled Treatment:    Reason Eval/Treat Not Completed: Patient not medically ready; patient continues on bedrest.  Will see when medically stable for mobility.   Harshitha Fretz 11/04/2016, 8:39 AM Magda Kiel, [place] 11/04/2016

## 2016-11-04 NOTE — Progress Notes (Signed)
Initial Nutrition Assessment  INTERVENTION:   Vital High Protein @ 45 ml/hr (1080 ml/day) Provides: 1080 kcal, 94 grams protein, and 902 ml free water.   TF regimen and propofol at current rate providing 1499 total kcal/day   NUTRITION DIAGNOSIS:   Inadequate oral intake related to inability to eat as evidenced by NPO status.  GOAL:   Patient will meet greater than or equal to 90% of their needs  MONITOR:   TF tolerance, I & O's, Vent status  REASON FOR ASSESSMENT:   Ventilator, Consult Enteral/tube feeding initiation and management  ASSESSMENT:   Pt with PMH of HTN, osteopenia, vitamin D deficiency admitted with lg ICH.    Discussed with RN. Husband at bedside reports that pt had no recent weight changes or changes in appetite. She does not eat a lot of meat and loves chocolate.   Patient is currently intubated on ventilator support MV: 6.4 L/min Temp (24hrs), Avg:98.4 F (36.9 C), Min:96.8 F (36 C), Max:100 F (37.8 C)  Propofol: 15.9 ml/hr provides: 419 kcal per day from lipid Medications reviewed and include: 3% saline Labs reviewed: TG 134 CBG's: 116 Nutrition-Focused physical exam completed. Findings are no fat depletion, no muscle depletion, and no edema.     Diet Order:  Diet NPO time specified  Skin:  Reviewed, no issues  Last BM:  unknown  Height:   Ht Readings from Last 1 Encounters:  11/04/2016 5\' 3"  (1.6 m)    Weight:   Wt Readings from Last 1 Encounters:  11/04/16 146 lb 6.2 oz (66.4 kg)    Ideal Body Weight:  52.2 kg  BMI:  Body mass index is 25.93 kg/m.  Estimated Nutritional Needs:   Kcal:  1450  Protein:  85-100 grams  Fluid:  > 1.5 L/day  EDUCATION NEEDS:   No education needs identified at this time  [place], [place], [place] Pager [phone] After Hours Pager

## 2016-11-04 NOTE — Progress Notes (Signed)
Vital signs stable Patient still having some spontaneous movement of left side Level of consciousness is on appear much different Remained stable on ventilator Neurosurgical intervention is not likely to the necessary Please contact us if further intervention or follow-up is needed

## 2016-11-04 NOTE — Progress Notes (Signed)
PULMONARY / CRITICAL CARE MEDICINE   Name: Mariah Flores MRN: [id number] DOB: 07-13-1955    ADMISSION DATE:  11/19/2016 CONSULTATION DATE:  11/12/2016  REFERRING MD:  Dr. Leonel Ramsay  CHIEF COMPLAINT:  Headache, nausea or vomiting  HISTORY OF PRESENT ILLNESS:   61 year old female with a past medical history significant for hypertension was brought to the emergency department by emergency medical services after her husband noted the sudden onset of nausea vomiting, slurred speech. She had been in her usual state of health until this morning when the symptoms developed suddenly. She was intubated on my exam and cannot provide history. EMS brought her to the emergency department where she initially was able to speak but her mental status worsened so she was intubated. She was found to have an intracerebral hemorrhage. Neurosurgery is evaluating her. Pulmonary and critical care medicine was consulted for ventilator management.  PAST MEDICAL HISTORY :  She  has a past medical history of Anxiety; Blood dyscrasia; Carpal tunnel syndrome; Elevated platelet count; History of MRSA infection; Hyperlipidemia; Hypertension; Hypothyroidism; Kidney stones; Lipoma; Osteopenia; and Vitamin D deficiency.  PAST SURGICAL HISTORY: She  has a past surgical history that includes Total abdominal hysterectomy w/ bilateral salpingoophorectomy (2009) and Lipoma excision (Left, 04/05/2015).  No Known Allergies  No current facility-administered medications on file prior to encounter.    Current Outpatient Prescriptions on File Prior to Encounter  Medication Sig  . aspirin 81 MG tablet Take 81 mg by mouth daily.  . hydrochlorothiazide (HYDRODIURIL) 25 MG tablet Take 1 tablet by mouth daily.  Marland Kitchen levothyroxine (SYNTHROID, LEVOTHROID) 88 MCG tablet Take 1 tablet by mouth daily.  Marland Kitchen omeprazole (PRILOSEC) 20 MG capsule Take 20 mg by mouth as needed.  Marland Kitchen PARoxetine (PAXIL) 40 MG tablet Take 1 tablet by mouth every morning.    . simvastatin (ZOCOR) 20 MG tablet Take 1 tablet by mouth daily.  Marland Kitchen telmisartan (MICARDIS) 40 MG tablet Take 1 tablet by mouth every morning.   . potassium chloride SA (K-DUR,KLOR-CON) 20 MEQ tablet Take 1 tablet (20 mEq total) by mouth 2 (two) times daily. (Patient not taking: Reported on 11/03/2016)    FAMILY HISTORY:  Her indicated that the status of her mother is unknown. She indicated that the status of her father is unknown.    SOCIAL HISTORY: She  reports that she has been smoking Cigarettes.  She has a 40.00 pack-year smoking history. She has never used smokeless tobacco. She reports that she does not drink alcohol or use drugs.  REVIEW OF SYSTEMS:   Cannot obtain due to intubation  SUBJECTIVE:  No issues overnight. Remains on vent  VITAL SIGNS: BP 132/72   Pulse 70   Temp 97.9 F (36.6 C)   Resp 16   Ht 5\' 3"  (1.6 m)   Wt 146 lb 6.2 oz (66.4 kg)   SpO2 100%   BMI 25.93 kg/m   HEMODYNAMICS:    VENTILATOR SETTINGS: Vent Mode: PRVC FiO2 (%):  [30 %] 30 % Set Rate:  [16 bmp] 16 bmp Vt Set:  [420 mL] 420 mL PEEP:  [5 cmH20] 5 cmH20 Pressure Support:  [5 cmH20] 5 cmH20 Plateau Pressure:  [18 cmH20-25 cmH20] 23 cmH20  INTAKE / OUTPUT: I/O last 3 completed shifts: In: 1585.8 [I.V.:1485.8; IV Piggyback:100] Out: 2210 [Urine:2210]  PHYSICAL EXAMINATION: Gen:      No acute distress HEENT:  EOMI, sclera anicteric Neck:     No masses; no thyromegaly, ETT Lungs:    Clear to auscultation  bilaterally; normal respiratory effort CV:         Regular rate and rhythm; no murmurs Abd:      + bowel sounds; soft, non-tender; no palpable masses, no distension Ext:    No edema; adequate peripheral perfusion Skin:      Warm and dry; no rash Neuro: Sedated, unresponsive  LABS:  BMET  Recent Labs Lab 11/16/2016 1332 11/16/2016 1334 11/03/16 0224  NA 136 137 141  K 2.6* 2.5* 3.6  CL 98* 97* 102  CO2 24  --  28  BUN 10 13 8   CREATININE 0.74 0.60 0.68  GLUCOSE 155* 152*  93    Electrolytes  Recent Labs Lab 11/20/2016 1332 11/13/2016 1630 11/03/16 0224  CALCIUM 9.1  --  8.6*  MG  --  1.9 1.9  PHOS  --   --  2.8    CBC  Recent Labs Lab 11/05/2016 1332 11/29/2016 1334 11/03/16 0224  WBC 11.4*  --  15.4*  HGB 14.3 15.6* 13.8  HCT 42.0 46.0 41.3  PLT 490*  --  423*    Coag's  Recent Labs Lab 11/13/2016 1332  APTT 27  INR 0.99    Sepsis Markers No results for input(s): LATICACIDVEN, PROCALCITON, O2SATVEN in the last 168 hours.  ABG  Recent Labs Lab 11/04/2016 1700  PHART 7.342*  PCO2ART 55.1*  PO2ART 463*    Liver Enzymes  Recent Labs Lab 11/17/2016 1332 11/03/16 0224  AST 38  --   ALT 30  --   ALKPHOS 69  --   BILITOT 0.5  --   ALBUMIN 4.2 3.6    Cardiac Enzymes No results for input(s): TROPONINI, PROBNP in the last 168 hours.  Glucose  Recent Labs Lab 11/12/2016 1328 11/03/16 0727  GLUCAP 148* 116*     STUDIES:  11/30/2016 CT head left basal ganglia hemorrhage with extension into the ventricles, hemorrhage volume estimated to be 28 mL, diffuse intra-articular extension with evidence for early hydrocephalus 8 mm of left-to-right midline shift  11/03/2016 CT head- Stable hemorrhage. no acute changes compared to 9/1  11/05/2016 Chest x-ray-ET tube in appropriate position, mild basilar atelectasis and small effusion.  CULTURES: None  ANTIBIOTICS: None  SIGNIFICANT EVENTS: 11/08/2016 admission  LINES/TUBES: 11/05/2016 endotracheal tube  DISCUSSION: 61 year old female with an intracerebral hemorrhage who required intubation for airway protection  ASSESSMENT / PLAN:  PULMONARY A: Acute respiratory failure with hypoxemia secondary to inability to protect airway P:   Continue vent support Daily SBTs. Poor mental status is a barrier to extubation  CARDIOVASCULAR A:  History of hypertension Intracerebral hemorrhage P:  Management per neurology Cardene gtt  RENAL A:   No acute issues P:   Monitor  urine output and lytes  GASTROINTESTINAL A:   No acute issues P:   Stress ulcer prophylaxis  Start tube feeds  HEMATOLOGIC A:   No acute issues P:  Follow CBC  INFECTIOUS A:   No acute issues P:   Monitor for fever Observe off antibiotic  ENDOCRINE A:   No acute issues   P:   Monitor glucose  NEUROLOGIC A:   Acute intracerebral hemorrhage Left-to-right midline shift Acute encephalopathy secondary to acute cerebral hemorrhage P:   RASS goal: 0 to -1 PAD protocol with propofol and intermittent fentanyl  FAMILY  - Updates: Patient's husband on 11/26/2016. No family at bedside today - Inter-disciplinary family meet or Palliative Care meeting due by:  day 7  The patient is critically ill with multiple organ system  failure and requires high complexity decision making for assessment and support, frequent evaluation and titration of therapies, advanced monitoring, review of radiographic studies and interpretation of complex data.   Critical Care Time devoted to patient care services, exclusive of separately billable procedures, described in this note is 35 minutes.   Marshell Garfinkel MD  Pulmonary and Critical Care Pager [phone] If no answer or after 3pm call: [phone] 11/04/2016, 7:14 AM

## 2016-11-04 NOTE — Progress Notes (Signed)
OT Cancellation Note  Patient Details Name: Mariah Flores MRN: [id number] DOB: 1955/08/23   Cancelled Treatment:    Reason Eval/Treat Not Completed: Patient not medically ready. Pt continues with bedrest orders. Will await increase in activity orders as appropriate. Thank you for this referral!  Norman Herrlich, MS OTR/L  Pager: [place] 11/04/2016, 7:14 AM

## 2016-11-04 NOTE — Progress Notes (Signed)
STROKE TEAM PROGRESS NOTE   HISTORY OF PRESENT ILLNESS (per record) Mariah Flores is a 61 y.o. female with a history of hypertension who presents with right sided weakness and aphasia. The husband spoke with her around 11:30 am and she was her normal self. He subsequently heard her cry out and went into the room and she was less responsive. He called 911. On arrival, she was still following commands, though with  A right hemiparesis.  She had repeated emesis.   LKW: 11:30 am tpa given?: no, ICH ICH Score: 2 Modified Rankin Score: 0  Patient was not administered IV t-PA secondary to [place]. She was admitted to the neuro ICU for further evaluation and treatment.   SUBJECTIVE (INTERVAL HISTORY) Her husband and daughter  were at the bedside. .Patient is still intubated. She is awake but remains aphasic and does not follow commands and continues to have dense right hemiplegia. Blood pressure is adequately controlled. Follow-up CT scan of the head 11/03/16 showed stable appearance of the left basal ganglia hemorrhage with intraventricular extension and mild hydrocephalus and 7 mm left to right midline shift.  OBJECTIVE Temp:  [96.8 F (36 C)-100.2 F (37.9 C)] 100.2 F (37.9 C) (09/03 1500) Pulse Rate:  [69-107] 89 (09/03 1500) Cardiac Rhythm: Normal sinus rhythm (09/03 0730) Resp:  [15-25] 18 (09/03 1500) BP: (93-160)/(59-90) 151/78 (09/03 1500) SpO2:  [99 %-100 %] 100 % (09/03 1500) FiO2 (%):  [30 %] 30 % (09/03 1115) Weight:  [146 lb 6.2 oz (66.4 kg)] 146 lb 6.2 oz (66.4 kg) (09/03 1200)    Physical Exam   Vitals:   11/04/16 1200 11/04/16 1300 11/04/16 1400 11/04/16 1500  BP: 139/85 (!) 160/90 (!) 159/83 (!) 151/78  Pulse: 84 83 95 89  Resp: 19 17 (!) 25 18  Temp: (!) 97.5 F (36.4 C) 98.4 F (36.9 C) 99.3 F (37.4 C) 100.2 F (37.9 C)  TempSrc:      SpO2: 100% 100% 100% 100%  Weight: 146 lb 6.2 oz (66.4 kg)     Height:        Constitutional: Pleasant middle-age Caucasian  lady Appears well-developed and well-nourished.   Cardiovascular: Normal rate and regular rhythm.  Respiratory: Clear breath sounds normal to anterior ascultation GI: Soft.  No distension. There is no tenderness.  Skin: WDI  Neuro: Mental Status: Patient is drowsy but arousable to stimuli, opens her eyes but  globally aphasic not following commands Cranial Nerves: II: Left gaze preference. Does not blink to threat on the right but  does so on the left Pupils are equal, round, and reactive to light.   III,IV, VI: does not clearlyc ross midline to the right.  V: Facial sensation is decreased on the right.  VII: Facial movement with right weakness  Motor: Right hemiplegia with flaccid right upper extremity paralysis. Trace withdrawal in the right lower extremity to pain. Purposeful spontaneous antigravity movements on the left side. Sensory: She responds less to noxious stim on the right than left.  Deep tendon reflexes are depressed on the right normal on the left. Right plantar is upgoing left is downgoing  CBC:   Recent Labs Lab 11/14/2016 1332 11/24/2016 1334 11/03/16 0224  WBC 11.4*  --  15.4*  NEUTROABS 7.3  --  12.2*  HGB 14.3 15.6* 13.8  HCT 42.0 46.0 41.3  MCV 79.8  --  80.8  PLT 490*  --  423*    Basic Metabolic Panel:   Recent Labs Lab 11/25/2016 1332  11/11/2016 1334  11/03/16 0224 11/04/16 1038 11/04/16 1245  NA 136 137  --  141 140  --   K 2.6* 2.5*  --  3.6  --   --   CL 98* 97*  --  102  --   --   CO2 24  --   --  28  --   --   GLUCOSE 155* 152*  --  93  --   --   BUN 10 13  --  8  --   --   CREATININE 0.74 0.60  --  0.68  --   --   CALCIUM 9.1  --   --  8.6*  --   --   MG  --   --   < > 1.9  --  2.4  PHOS  --   --   --  2.8  --  3.0  < > = values in this interval not displayed.  Lipid Panel:     Component Value Date/Time   CHOL 200 11/04/2016 0205   TRIG 134 11/04/2016 0205   HDL 50 11/04/2016 0205   CHOLHDL 4.0 11/04/2016 0205   VLDL 27  11/04/2016 0205   LDLCALC 123 (H) 11/04/2016 0205   HgbA1c:  Lab Results  Component Value Date   HGBA1C 5.9 (H) 11/04/2016   Urine Drug Screen:     Component Value Date/Time   LABOPIA NONE DETECTED 11/10/2016 1535   COCAINSCRNUR NONE DETECTED 11/25/2016 1535   LABBENZ NONE DETECTED 11/19/2016 1535   AMPHETMU NONE DETECTED 11/21/2016 1535   THCU NONE DETECTED 11/20/2016 1535   LABBARB NONE DETECTED 11/30/2016 1535    Alcohol Level     Component Value Date/Time   ETH <5 11/14/2016 1332    IMAGING  Ct Head Wo Contrast 11/03/2016 1. Stable acute hemorrhage (42 cc) centered in left basal ganglia. Stable intraventricular hemorrhage. Stable mild hydrocephalus and 7 mm left-to-right midline shift.  2. No new acute intracranial hemorrhage, acute infarct, or focal mass effect identified.    Ct Head Wo Contrast 11/15/2016 1. Stable acute hemorrhage (42 cc) within the left basal ganglia and intraventricular hemorrhage.  2. Stable mild dilatation of ventricles and left-to-right midline shift of 8 mm. No downward herniation.  3. No new intracranial hemorrhage or infarct identified.    Ct Head Wo Contrast 11/28/2016   1. Very mild enlargement of the intra-axial hyperdense hemorrhage since 1342 hours today; estimated blood volume now 42 mL versus 31 mL earlier.  2. Regional mass effect and rightward midline shift of 8 mm are unchanged.  3. Intraventricular extension of hemorrhage, IVH blood volume and ventricle size are also stable.  4. No new intracranial abnormality.    Ct Head Code Stroke Wo Contrast 11/15/2016 1. Left alignment in basal ganglia hemorrhage with extension into the ventricles.  2. Hemorrhage volume is estimated at 28 cc.  3. Diffuse intra-articular extension with evidence for early hydrocephalus.  4. 8 mm of left-to-right midline shift.    Dg Chest Portable 1 View 11/08/2016 Support apparatus in good position Cardiomegaly with mild vascular congestion, bibasilar  atelectasis, and trace pleural effusions       ASSESSMENT/PLAN Ms. Mariah Flores is a 61 y.o. female with history of hypertension, hypothyroidism, hyperlipidemia, tobacco use, blood dyscrasia, and anxiety, presenting with right-sided weakness and aphasia. She did not receive IV t-PA due to [place].  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  Resultant    CT head - Stable acute hemorrhage (42 cc) centered in left basal ganglia. Stable intraventricular hemorrhage. Stable mild hydrocephalus and 7 mm left-to-right midline shift.   MRI head - not performed  MRA head - not performed  Carotid Doppler - not indicated  2D Echo - not indicated  LDL - 123  HgbA1c - 5.9  VTE prophylaxis - SCDs Diet NPO time specified  aspirin 81 mg daily prior to admission, now on No antithrombotic  Ongoing aggressive stroke risk factor management  Therapy recommendations: pending  Disposition:  Pending  Hypertension  Stable (IV Cardene)  SBP goal < 140  Long-term BP goal normotensive  Hyperlipidemia  Home meds:  Zocor 20 mg daily not resumed in hospital  LDL 123, goal < 70  Continue statin at discharge    Other Stroke Risk Factors  Advanced age  Cigarette smoker - will be advised to stop smoking   Other Active Problems  Leukocytosis - afebrile  Hypokalemia - supplemented and resolved  Hospital day # 2 I have personally examined this patient, reviewed notes, independently viewed imaging studies, participated in medical decision making and plan of care.ROS completed by me personally and pertinent positives fully documented  I have made any additions or clarifications directly to the above note. She has presented with left basal ganglia hemorrhage with intraventricular extension, hydrocephalus and left-to-right brain herniation. Prognosis is guarded. Recommend start hypertonic saline to  treat cytotoxic edema and maintain strict control of hypertension and close neurological monitoring. The patient's husband and daughter are present at the bedside and had been long discussion with them about her prognosis and plan of care. The patient has a living will and the husband is quite clear that she would not want prolonged ventilatory support or live a life of major disability. I recommend we continue current care for the next few days and see how she responds to treatment. This patient is critically ill and at significant risk of neurological worsening, death and care requires constant monitoring of vital signs, hemodynamics,respiratory and cardiac monitoring, extensive review of multiple databases, frequent neurological assessment, discussion with family, other specialists and medical decision making of high complexity.I have made any additions or clarifications directly to the above note.This critical care time does not reflect procedure time, or teaching time or supervisory time of PA/NP/Med Resident etc but could involve care discussion time.  I spent 40 minutes of neurocritical care time  in the care of  this patient.      Antony Contras, MD Medical Director [place] Pager: [phone] 11/04/2016 3:27 PM    To contact Stroke Continuity provider, please refer to [email or web address] After hours, contact General Neurology

## 2016-11-05 DIAGNOSIS — G919 Hydrocephalus, unspecified: Secondary | ICD-10-CM

## 2016-11-05 DIAGNOSIS — I10 Essential (primary) hypertension: Secondary | ICD-10-CM

## 2016-11-05 DIAGNOSIS — G935 Compression of brain: Secondary | ICD-10-CM

## 2016-11-05 DIAGNOSIS — I615 Nontraumatic intracerebral hemorrhage, intraventricular: Principal | ICD-10-CM

## 2016-11-05 DIAGNOSIS — Z515 Encounter for palliative care: Secondary | ICD-10-CM

## 2016-11-05 DIAGNOSIS — Z7189 Other specified counseling: Secondary | ICD-10-CM

## 2016-11-05 LAB — BASIC METABOLIC PANEL WITH GFR
Anion gap: 8 (ref 5–15)
BUN: 19 mg/dL (ref 6–20)
CO2: 27 mmol/L (ref 22–32)
Calcium: 8.4 mg/dL — ABNORMAL LOW (ref 8.9–10.3)
Chloride: 117 mmol/L — ABNORMAL HIGH (ref 101–111)
Creatinine, Ser: 0.52 mg/dL (ref 0.44–1.00)
GFR calc Af Amer: >60 mL/min
GFR calc non Af Amer: >60 mL/min
Glucose, Bld: 140 mg/dL — ABNORMAL HIGH (ref 65–99)
Potassium: 3 mmol/L — ABNORMAL LOW (ref 3.5–5.1)
Sodium: 152 mmol/L — ABNORMAL HIGH (ref 135–145)

## 2016-11-05 LAB — PHOSPHORUS: Phosphorus: 2.3 mg/dL — ABNORMAL LOW (ref 2.5–4.6)

## 2016-11-05 LAB — GLUCOSE, CAPILLARY
Glucose-Capillary: 108 mg/dL — ABNORMAL HIGH (ref 65–99)
Glucose-Capillary: 110 mg/dL — ABNORMAL HIGH (ref 65–99)

## 2016-11-05 LAB — MAGNESIUM: MAGNESIUM: 2.3 mg/dL (ref 1.7–2.4)

## 2016-11-05 LAB — SODIUM: SODIUM: 148 mmol/L — AB (ref 135–145)

## 2016-11-05 MED ORDER — AMLODIPINE BESYLATE 5 MG PO TABS
5.0000 mg | ORAL_TABLET | Freq: Every day | ORAL | Status: DC
  Administered 2016-11-05: 5 mg
  Filled 2016-11-05: qty 1

## 2016-11-05 MED ORDER — SODIUM CHLORIDE 0.9 % IV SOLN
10.0000 mg/h | INTRAVENOUS | Status: DC
  Administered 2016-11-05: 10 mg/h via INTRAVENOUS
  Filled 2016-11-05: qty 10

## 2016-11-05 MED ORDER — LABETALOL HCL 5 MG/ML IV SOLN
20.0000 mg | INTRAVENOUS | Status: DC | PRN
  Administered 2016-11-05: 20 mg via INTRAVENOUS
  Filled 2016-11-05: qty 4

## 2016-11-05 MED ORDER — ACETAMINOPHEN 325 MG PO TABS
650.0000 mg | ORAL_TABLET | Freq: Once | ORAL | Status: AC
  Administered 2016-11-05: 650 mg via ORAL

## 2016-11-05 MED ORDER — LABETALOL HCL 5 MG/ML IV SOLN
20.0000 mg | Freq: Once | INTRAVENOUS | Status: AC
  Administered 2016-11-05: 20 mg via INTRAVENOUS

## 2016-11-05 MED ORDER — LABETALOL HCL 5 MG/ML IV SOLN
INTRAVENOUS | Status: AC
  Filled 2016-11-05: qty 4

## 2016-11-05 MED ORDER — MORPHINE BOLUS VIA INFUSION
5.0000 mg | INTRAVENOUS | Status: DC | PRN
  Filled 2016-11-05: qty 20

## 2016-11-05 NOTE — Progress Notes (Signed)
PULMONARY / CRITICAL CARE MEDICINE   Name: Mariah Flores MRN: [id number] DOB: 11-17-1955    ADMISSION DATE:  11/12/2016 CONSULTATION DATE:  11/29/2016  REFERRING MD:  Dr. Leonel Ramsay  CHIEF COMPLAINT:  Headache, nausea or vomiting  HISTORY OF PRESENT ILLNESS:   61 year old female with a past medical history significant for hypertension was brought to the emergency department by emergency medical services after her husband noted the sudden onset of nausea vomiting, slurred speech. She had been in her usual state of health until this morning when the symptoms developed suddenly. She was intubated on my exam and cannot provide history. EMS brought her to the emergency department where she initially was able to speak but her mental status worsened so she was intubated. She was found to have an intracerebral hemorrhage. Neurosurgery is evaluating her. Pulmonary and critical care medicine was consulted for ventilator management.  SUBJECTIVE:  Severely hypertensive, no further events overnight  VITAL SIGNS: BP (!) 156/80   Pulse 85   Temp 99.7 F (37.6 C)   Resp 19   Ht 5\' 3"  (1.6 m)   Wt 65.8 kg (145 lb 1 oz)   SpO2 100%   BMI 25.70 kg/m   HEMODYNAMICS:    VENTILATOR SETTINGS: Vent Mode: PSV;CPAP FiO2 (%):  [30 %] 30 % Set Rate:  [16 bmp] 16 bmp Vt Set:  [420 mL] 420 mL PEEP:  [5 cmH20] 5 cmH20 Pressure Support:  [10 cmH20] 10 cmH20 Plateau Pressure:  [21 cmH20] 21 cmH20  INTAKE / OUTPUT: I/O last 3 completed shifts: In: 2866 [I.V.:2026.8; NG/GT:839.3] Out: 2260 [Urine:2260]  PHYSICAL EXAMINATION: Gen:      No acute distress, acutely ill appearing female HEENT:  [place]/AT, PERRL, EOM-I and MMM Neck:      ETT in place, no masses Lungs:    CTA bilaterally CV:         RRR, Nl S1/S2, -M/R/G Abd:      Soft, NT, ND and +BS Ext:    -edema and -tenderness Skin:       Warm and dry; no rash Neuro:    No sedation on board, intermittently agitated during  exam.  LABS:  BMET  Recent Labs Lab 11/23/2016 1332 11/23/2016 1334 11/03/16 0224  11/04/16 1515 11/04/16 2111 11/05/16 0336  NA 136 137 141  < > 143 141 148*  K 2.6* 2.5* 3.6  --   --   --   --   CL 98* 97* 102  --   --   --   --   CO2 24  --  28  --   --   --   --   BUN 10 13 8   --   --   --   --   CREATININE 0.74 0.60 0.68  --   --   --   --   GLUCOSE 155* 152* 93  --   --   --   --   < > = values in this interval not displayed.  Electrolytes  Recent Labs Lab 11/20/2016 1332  11/03/16 0224 11/04/16 1245 11/04/16 1515 11/05/16 0336  CALCIUM 9.1  --  8.6*  --   --   --   MG  --   < > 1.9 2.4 2.2 2.3  PHOS  --   < > 2.8 3.0 2.1* 2.3*  < > = values in this interval not displayed.  CBC  Recent Labs Lab 11/29/2016 1332 11/10/2016 1334 11/03/16 0224  WBC 11.4*  --  15.4*  HGB 14.3 15.6* 13.8  HCT 42.0 46.0 41.3  PLT 490*  --  423*   Coag's  Recent Labs Lab 11/03/2016 1332  APTT 27  INR 0.99   Sepsis Markers No results for input(s): LATICACIDVEN, PROCALCITON, O2SATVEN in the last 168 hours.  ABG  Recent Labs Lab 11/29/2016 1700  PHART 7.342*  PCO2ART 55.1*  PO2ART 463*   Liver Enzymes  Recent Labs Lab 11/21/2016 1332 11/03/16 0224  AST 38  --   ALT 30  --   ALKPHOS 69  --   BILITOT 0.5  --   ALBUMIN 4.2 3.6   Cardiac Enzymes No results for input(s): TROPONINI, PROBNP in the last 168 hours.  Glucose  Recent Labs Lab 11/04/16 1540 11/04/16 1924 11/04/16 2016 11/04/16 2301 11/05/16 0305 11/05/16 0757  GLUCAP 67 57* 138* 121* 108* 110*   STUDIES:  12/01/2016 CT head left basal ganglia hemorrhage with extension into the ventricles, hemorrhage volume estimated to be 28 mL, diffuse intra-articular extension with evidence for early hydrocephalus 8 mm of left-to-right midline shift  11/03/2016 CT head- Stable hemorrhage. no acute changes compared to 9/1  11/05/2016 Chest x-ray-ET tube in appropriate position, mild basilar atelectasis and small  effusion.  CULTURES: None  ANTIBIOTICS: None  SIGNIFICANT EVENTS: 11/19/2016 admission  LINES/TUBES: 11/13/2016 endotracheal tube>>>  DISCUSSION: 61 year old female with an intracerebral hemorrhage who required intubation for airway protection.  Family brought in patient's living will, no long term support.  ASSESSMENT / PLAN:  PULMONARY A: Acute respiratory failure with hypoxemia secondary to inability to protect airway P:   Begin PS trials but no extubation given mental status No trach/peg Will give a few days with hypertonic saline and if no improvement likely proceed with comfort care.  CARDIOVASCULAR A:  History of hypertension Intracerebral hemorrhage P:  Management per neurology Labetalol PRN ordered for BP control Tele monitoring BP management per neurology   RENAL A:   No acute issues P:   Monitor urine output and lytes BMET now Replace electrolytes as indicated  GASTROINTESTINAL A:   No acute issues P:   Stress ulcer prophylaxis  Continue tube feeds  HEMATOLOGIC A:   No acute issues P:  Follow CBC  INFECTIOUS A:   No acute issues P:   Monitor for fever Observe off antibiotic  ENDOCRINE A:   No acute issues   P:   Monitor glucose  NEUROLOGIC A:   Acute intracerebral hemorrhage Left-to-right midline shift Acute encephalopathy secondary to acute cerebral hemorrhage P:   RASS goal: 0 to -1 PAD protocol with propofol and intermittent fentanyl  FAMILY  - Updates: No family bedside 9/4 but per RN and chart review, patient did not want aggressive measures and would not want long term support.  If not improvement in the next few days then will likely be a terminal wean to comfort care.  - Inter-disciplinary family meet or Palliative Care meeting due by:  day 7  The patient is critically ill with multiple organ systems failure and requires high complexity decision making for assessment and support, frequent evaluation and titration  of therapies, application of advanced monitoring technologies and extensive interpretation of multiple databases.   Critical Care Time devoted to patient care services described in this note is  35  Minutes. This time reflects time of care of this signee Dr Jennet Maduro. This critical care time does not reflect procedure time, or teaching time or supervisory time of PA/NP/Med student/Med Resident etc but could involve care  discussion time.  Rush Farmer, M.D. [facility] Pulmonary/Critical Care Medicine. Pager: [phone]. After hours pager: [phone].  11/05/2016, 8:46 AM

## 2016-11-05 NOTE — Progress Notes (Signed)
PT Cancellation Note  Patient Details Name: Mariah Flores MRN: [id number] DOB: 30-Aug-1955   Cancelled Treatment:    Reason Eval/Treat Not Completed: Patient not medically ready; patient remains intubated and sedated on bedrest.  Will hold on PT eval until medically ready.   Nelsie Domino 11/05/2016, 8:15 AM  Magda Kiel, [place] 11/05/2016

## 2016-11-05 NOTE — Procedures (Signed)
Extubation Procedure Note  Patient Details:   Name: Mariah Flores DOB: 1955/05/27 MRN: [id number]   Airway Documentation:     Evaluation  O2 sats: stable throughout Complications: No apparent complications Patient did tolerate procedure well. Bilateral Breath Sounds: Rhonchi   No  Pt. Was terminally extubated to a 2L [place] with RN at the bedside without any complications.  Yvetta Drotar, [place] 11/05/2016, 12:14 PM

## 2016-11-05 NOTE — Care Management Note (Signed)
Case Management Note  Patient Details  Name: Mariah Flores MRN: [id number] Date of Birth: 11-05-1955  Subjective/Objective:    Pt admitted on 11/05/2016 with Lt basal ganglia hemorrhage.  PTA, pt independent,lives with husband.                  Action/Plan: Family has chosen to offer comfort measures, as they feel pt would not want to live with disability. Will follow/offer support as needed.    Expected Discharge Date:                  Expected Discharge Plan:     In-House Referral:     Discharge planning Services  CM Consult  Post Acute Care Choice:    Choice offered to:     DME Arranged:    DME Agency:     HH Arranged:    HH Agency:     Status of Service:  In process, will continue to follow  If discussed at Long Length of Stay Meetings, dates discussed:    Additional Comments:  Ella Bodo, RN 11/05/2016, 5:15 PM

## 2016-11-05 NOTE — Progress Notes (Signed)
OT Cancellation Note  Patient Details Name: Mariah Flores MRN: [id number] DOB: 1955-10-23   Cancelled Treatment:    Reason Eval/Treat Not Completed: Patient not medically ready (bedrest continues) OT order received and appreciated however this conflicts with current bedrest order set. Please increase activity tolerance as appropriate and remove bedrest from orders. . OT will hold evaluation at this time and will check back as time allows pending increased activity orders.   Peri Maris  [phone] 11/05/2016, 7:15 AM

## 2016-11-05 NOTE — Progress Notes (Signed)
STROKE TEAM PROGRESS NOTE   SUBJECTIVE (INTERVAL HISTORY) Her husband and daughter are at the bedside.  Patient is still intubated.  She is awake but remains aphasic and does not follow commands and continues to have dense right hemiplegia.  Hypertensive overnight.  Added norvasc 5 mg PO daily.  Brief discussion of goals of care with husband, who is very insistent that pt would never want to be intubated or survive a stroke that left her hemiplegic, and has a living will that states such.  Per PCCM note, the husband requested terminal extubation and comfort care.  Pt terminally extubated to 2L [place] at 12:27pm.  OBJECTIVE Temp:  [98 F (36.7 C)-101.3 F (38.5 C)] 99.7 F (37.6 C) (09/04 1115) Pulse Rate:  [67-99] 92 (09/04 1214) Cardiac Rhythm: Normal sinus rhythm (09/04 0800) Resp:  [13-31] 14 (09/04 1214) BP: (113-218)/(52-127) 145/73 (09/04 1214) SpO2:  [97 %-100 %] 97 % (09/04 1214) FiO2 (%):  [30 %] 30 % (09/04 0730) Weight:  [65.8 kg (145 lb 1 oz)] 65.8 kg (145 lb 1 oz) (09/04 0500)    Physical Exam   Vitals:   11/05/16 1045 11/05/16 1100 11/05/16 1115 11/05/16 1214  BP: 113/63 (!) 148/73 137/74 (!) 145/73  Pulse: 89 92 87 92  Resp: 20 20 19 14   Temp: 99.7 F (37.6 C) 99 F (37.2 C) 99.7 F (37.6 C)   TempSrc:      SpO2: 98% 98% 97% 97%  Weight:      Height:        Constitutional: Pleasant middle-age Caucasian lady Appears well-developed and well-nourished.   Cardiovascular: Normal rate and regular rhythm.  Respiratory: Clear breath sounds normal to anterior ascultation GI: Soft.  No distension. There is no tenderness.  Skin: WDI  Neuro: Mental Status: Patient is intubated. drowsy but arousable to stimuli, opens her eyes but  globally aphasic not following commands Cranial Nerves: II: Left gaze preference. Does not blink to threat on the right but  does so on the left Pupils are equal, round, and reactive to light.   III,IV, VI: does not clearlyc ross midline to  the right.  V: Facial sensation is decreased on the right.  VII: Facial movement with right weakness  Motor: Right hemiplegia with flaccid right upper extremity paralysis. Trace withdrawal in the right lower extremity to pain. Purposeful spontaneous antigravity movements on the left side. Sensory: She responds less to noxious stim on the right than left.  Deep tendon reflexes are depressed on the right normal on the left. Right plantar is upgoing left is downgoing  CBC:   Recent Labs Lab 11/21/2016 1332 11/15/2016 1334 11/03/16 0224  WBC 11.4*  --  15.4*  NEUTROABS 7.3  --  12.2*  HGB 14.3 15.6* 13.8  HCT 42.0 46.0 41.3  MCV 79.8  --  80.8  PLT 490*  --  423*    Basic Metabolic Panel:   Recent Labs Lab 11/03/16 0224  11/04/16 1515  11/05/16 0336 11/05/16 0926  NA 141  < > 143  < > 148* 152*  K 3.6  --   --   --   --  3.0*  CL 102  --   --   --   --  117*  CO2 28  --   --   --   --  27  GLUCOSE 93  --   --   --   --  140*  BUN 8  --   --   --   --  19  CREATININE 0.68  --   --   --   --  0.52  CALCIUM 8.6*  --   --   --   --  8.4*  MG 1.9  < > 2.2  --  2.3  --   PHOS 2.8  < > 2.1*  --  2.3*  --   < > = values in this interval not displayed.  Lipid Panel:     Component Value Date/Time   CHOL 200 11/04/2016 0205   TRIG 134 11/04/2016 0205   HDL 50 11/04/2016 0205   CHOLHDL 4.0 11/04/2016 0205   VLDL 27 11/04/2016 0205   LDLCALC 123 (H) 11/04/2016 0205   HgbA1c:  Lab Results  Component Value Date   HGBA1C 5.9 (H) 11/04/2016   Urine Drug Screen:     Component Value Date/Time   LABOPIA NONE DETECTED 11/22/2016 1535   COCAINSCRNUR NONE DETECTED 11/17/2016 1535   LABBENZ NONE DETECTED 11/19/2016 1535   AMPHETMU NONE DETECTED 11/08/2016 1535   THCU NONE DETECTED 11/21/2016 1535   LABBARB NONE DETECTED 11/17/2016 1535    Alcohol Level     Component Value Date/Time   ETH <5 11/05/2016 1332    IMAGING  Ct Head Wo Contrast 11/03/2016 1. Stable acute  hemorrhage (42 cc) centered in left basal ganglia. Stable intraventricular hemorrhage. Stable mild hydrocephalus and 7 mm left-to-right midline shift.  2. No new acute intracranial hemorrhage, acute infarct, or focal mass effect identified.    Ct Head Wo Contrast 11/26/2016 1. Stable acute hemorrhage (42 cc) within the left basal ganglia and intraventricular hemorrhage.  2. Stable mild dilatation of ventricles and left-to-right midline shift of 8 mm. No downward herniation.  3. No new intracranial hemorrhage or infarct identified.    Ct Head Wo Contrast 11/08/2016   1. Very mild enlargement of the intra-axial hyperdense hemorrhage since 1342 hours today; estimated blood volume now 42 mL versus 31 mL earlier.  2. Regional mass effect and rightward midline shift of 8 mm are unchanged.  3. Intraventricular extension of hemorrhage, IVH blood volume and ventricle size are also stable.  4. No new intracranial abnormality.    Ct Head Code Stroke Wo Contrast 11/12/2016 1. Left alignment in basal ganglia hemorrhage with extension into the ventricles.  2. Hemorrhage volume is estimated at 28 cc.  3. Diffuse intra-articular extension with evidence for early hydrocephalus.  4. 8 mm of left-to-right midline shift.    Dg Chest Portable 1 View 11/12/2016 Support apparatus in good position Cardiomegaly with mild vascular congestion, bibasilar atelectasis, and trace pleural effusions       ASSESSMENT/PLAN Ms. CORY RAMA is a 61 y.o. female with history of hypertension, hypothyroidism, hyperlipidemia, tobacco use, blood dyscrasia, and anxiety, presenting with right-sided weakness and aphasia. She did not receive IV t-PA due to [place].  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  Resultant    CT head - Stable acute hemorrhage (42 cc) centered in left basal ganglia. Stable intraventricular hemorrhage. Stable  mild hydrocephalus and 7 mm left-to-right midline shift.   MRI head - not performed  MRA head - not performed  Carotid Doppler - not indicated  2D Echo - not indicated  LDL - 123  HgbA1c - 5.9  VTE prophylaxis - SCDs Diet NPO time specified  aspirin 81 mg daily prior to admission, now on No antithrombotic  Ongoing aggressive stroke risk factor management  Therapy recommendations:signed  off  Disposition: pending  Hypertension  Stable (IV Cardene)  SBP goal < 140  Long-term BP goal normotensive  Hyperlipidemia  Home meds:  Zocor 20 mg daily not resumed in hospital  LDL 123, goal < 70  Continue statin at discharge  Other Stroke Risk Factors  Advanced age  Cigarette smoker - will be advised to stop smoking   Other Active Problems  Leukocytosis - afebrile  Hypokalemia - supplemented and resolved  Hospital day # 3 I have personally examined this patient, reviewed notes, independently viewed imaging studies, participated in medical decision making and plan of care.ROS completed by me personally and pertinent positives fully documented  I have made any additions or clarifications directly to the above note. She has presented with left basal ganglia hemorrhage with intraventricular extension, hydrocephalus and left-to-right brain herniation. Prognosis is guarded. the patient's husband and daughter are present at the bedside and had been long discussion with them about her prognosis and plan of care. The patient has a living will and the husband is quite clear that she would not want prolonged ventilatory support or live a life of major disability.he is insistent on terminal extubation and comfort care and does not want to wait a few days and see how she responds to treatment.Hence will make her comfort care and start morphine drip This patient is critically ill and at significant risk of neurological worsening, death and care requires constant monitoring of vital signs,  hemodynamics,respiratory and cardiac monitoring, extensive review of multiple databases, frequent neurological assessment, discussion with family, other specialists and medical decision making of high complexity.I have made any additions or clarifications directly to the above note.This critical care time does not reflect procedure time, or teaching time or supervisory time of PA/NP/Med Resident etc but could involve care discussion time.  I spent 35 minutes of neurocritical care time  in the care of  this patient.      Antony Contras, MD Medical Director [place] Pager: [phone] 11/05/2016 3:31 PM    To contact Stroke Continuity provider, please refer to [email or web address] After hours, contact General Neurology

## 2016-11-05 NOTE — Progress Notes (Signed)
Responded to [facility] Consult to provide support to family at bedside.  Patient unable to communicate. No immediate needs identified. Family is accepting and coping well.  Chaplain will follow as needed.    11/05/16 1500  Clinical Encounter Type  Visited With Patient and family together  Visit Type Initial;Spiritual support;Patient actively dying  Referral From Nurse  Spiritual Encounters  Spiritual Needs Emotional;Grief support  Stress Factors  Family Stress Factors Major life changes  Cristopher Peru, [facility], Pager [phone]

## 2016-11-05 NOTE — Progress Notes (Signed)
Spoke with husband and only daughter.  They are all very clear that patient did not even want to be intubated.  I informed them that we do not know the prognosis at this point since we have not completed the course of hypertonic saline.  I was very clear that we can not prognosticate.  Family informed me that unless I can tell them with 297% certainty that she will go back to normal.  I informed them that there is no way I can do that or that anybody can do that in all good faith.  She will have residual damage.  Based on that information they informed that they would like to proceed with comfort and DNR.  Will place DNR order and place withdrawal orderset.  Family is ready.  The patient is critically ill with multiple organ systems failure and requires high complexity decision making for assessment and support, frequent evaluation and titration of therapies, application of advanced monitoring technologies and extensive interpretation of multiple databases.   Critical Care Time devoted to patient care services described in this note is  60  Minutes. This time reflects time of care of this signee Dr Jennet Maduro. This critical care time does not reflect procedure time, or teaching time or supervisory time of PA/NP/Med student/Med Resident etc but could involve care discussion time.  Rush Farmer, M.D. [facility] Pulmonary/Critical Care Medicine. Pager: [phone]. After hours pager: [phone].

## 2016-11-05 NOTE — Plan of Care (Signed)
Problem: Self-Care: Goal: Ability to participate in self-care as condition permits will improve Outcome: Not Progressing Pt is intubated and sedated at this time.

## 2016-11-07 ENCOUNTER — Telehealth: Payer: Self-pay

## 2016-11-07 NOTE — Telephone Encounter (Signed)
Death Certificate on Dr. Leonie Man desk. Dr. Leonie Man will be in the office on 11/11/2016 to complete it.

## 2016-11-11 NOTE — Telephone Encounter (Signed)
Death certificate fax to provided number at [phone]. RN call funeral home at [phone] to state it was ready for pick up.

## 2016-12-02 NOTE — Discharge Summary (Signed)
Patient ID: AYDAN PHOENIX MRN: [id number] DOB/AGE: 63-Mar-1957 63 y.o.  Admit date: November 16, 2016 Death date: Nov 20, 2016 0123 hrs  Admission Diagnoses: Right-sided weakness  Cause of Death:  Respiratory arrest secondary to increase intracranial pressure due to brain herniation from large basal ganglia intracerebral hemorrhage. Patient made DO NOT RESUSCITATE and comfort care by family and ventilatory support withdrawn.  Pertinent Medical Diagnosis: Active Problems:   IVH (intraventricular hemorrhage) (HCC)   Hydrocephalus   Brain herniation [facility])   Hospital Course:  GIONNA POLAK is a 61 y.o. female with a history of hypertension who presents with right sided weakness and aphasia. The husband spoke with her around 11:30 am and she was her normal self. He subsequently heard her cry out and went into the room and she was less responsive. He called [phone]. On arrival, she was still following commands, though with  A right hemiparesis. CT scan of the brain on admission showed a 3.5 x 4.6 x 3.5 cm left basal ganglia hemorrhage with estimated volume of 28 cubic cc with intraventricular extension and hydrocephalus with 8 mm left to right midline shift. She had repeated emesis. LKW: 11:30 am tpa given?: no, ICH. ICH Score: 2 Premorbid Modified Rankin Score: 0.. Patient was intubated for airway protection and admitted to the intensive care unit. Blood pressure was tightly controlled on Cardene drip. The condition remained poor Pulmonary critical team was consulted to manage medical issues. Neurosurgery was consulted but patient was not felt to be candidate for any surgical intervention. The family understood patient's poor prognosis. Her condition remained poor. Follow-up CT scan showed stable appearance of the 42 cubic cc basal ganglia hemorrhage with intraventricular extension and mild hydrocephalus and 7 mm left to right midline shift. The patient's family realized that patient will require prolonged  ventilatory support and likely end up having to go to nursing home which was against her wishes. They made the patient DO NOT RESUSCITATE and comfort care. Patient wasn't extubated and was kept on morphine drip. Her condition declined rapidly and she passed away shortly thereafter. Signed: SETHI,PRAMOD 11/08/2016, 3:39 PM

## 2016-12-02 NOTE — Progress Notes (Signed)
Pt. Arrived to unit from 4N with family at bedside. Morphine drip going at 10. Pt. appeared comfortable but shallow breaths were noted. Patients HR was 136 and temp was 102.6. Comfort care only orders.  Called to room by daughter stating "I think mom has passed" Time of death 72. Pronounced by Junius Argyle, RN and Clovis Pu, RN Dr. Merrie Roof from [facility] notified. [place] donor service notified. Pt. Being held as possible tissue and eye donation. Eye's were prepped.  Pt. Taken to morgue at [facility]. Pt. Had no belongings at bedside.  Jimmie Molly, RN

## 2016-12-02 NOTE — Progress Notes (Signed)
100 ML of morphine wasted at time of death with Clovis Pu.  Jimmie Molly, RN

## 2016-12-02 DEATH — deceased

## 2018-04-18 IMAGING — CT CT HEAD CODE STROKE
3 of 4 series · 14 of 47 positions shown, 16 images · non-contrast
Comparison: None.

CLINICAL DATA: Code stroke. Code stroke. Acute onset of right-sided
weakness, slurred speech, and facial droop.

EXAM:
CT HEAD WITHOUT CONTRAST
TECHNIQUE: Contiguous axial images were obtained from the base of the skull
through the vertex without intravenous contrast.

[Series 4: head 2.0 h70h · axial · 0.43mm/px · z∈[-136,-10]mm · 8 of 79 slices shown, 10 images]
[im 8/79  brain]
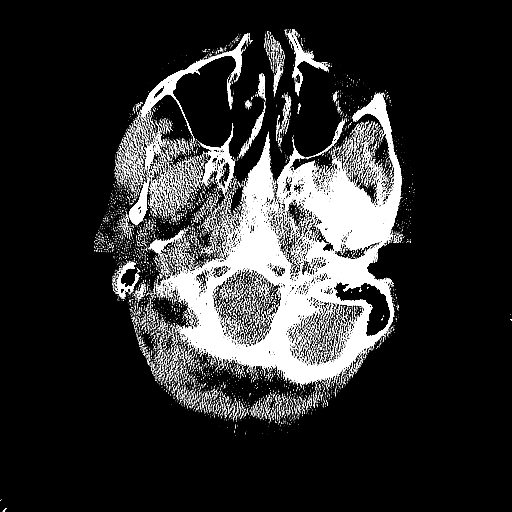
[im 8/79  bone]
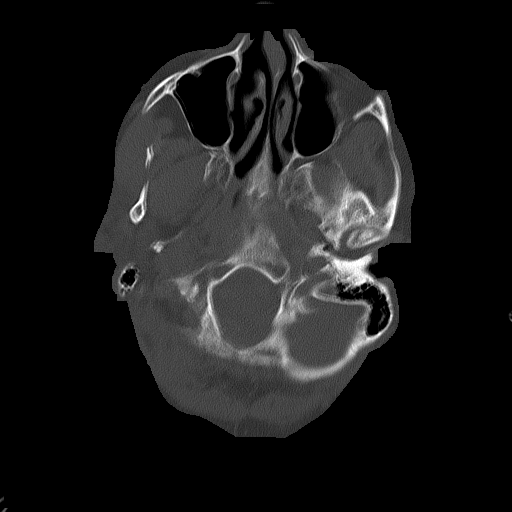
[im 16/79  brain]
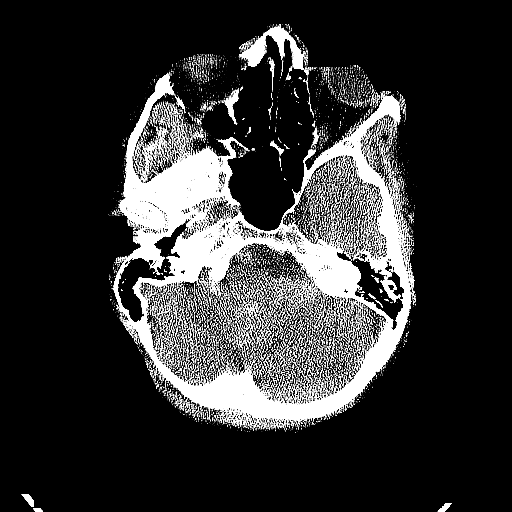
[im 24/79  brain]
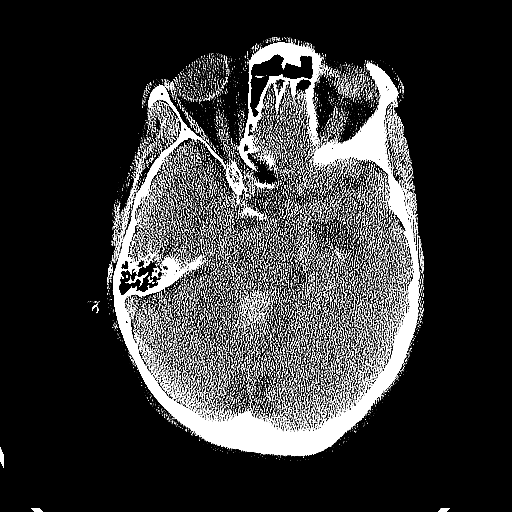
[im 36/79  brain]
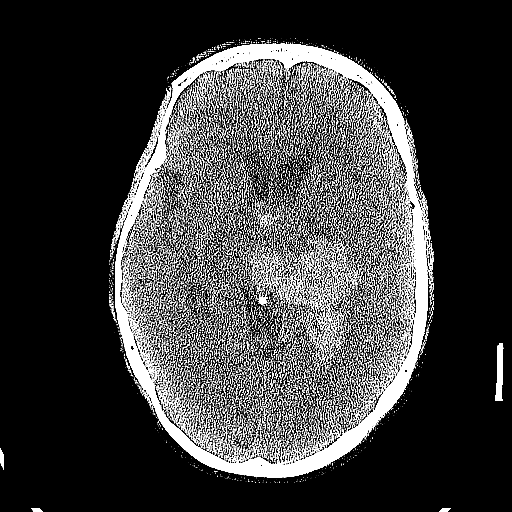
[im 43/79  brain]
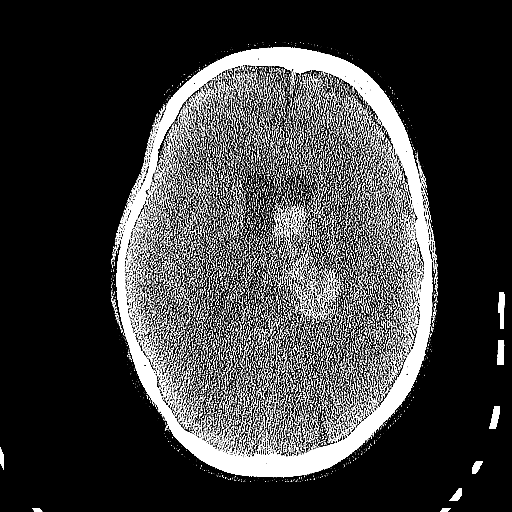
[im 43/79  bone]
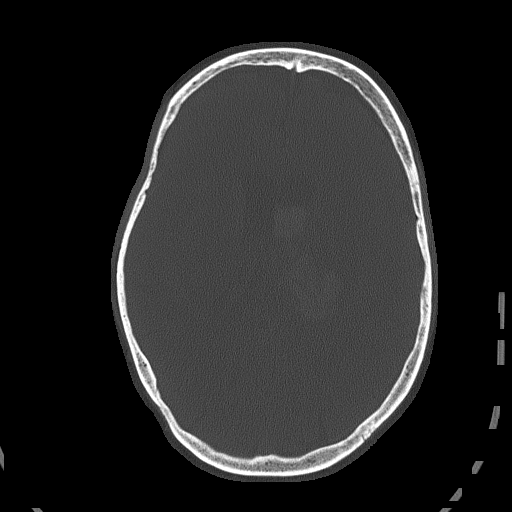
[im 55/79  brain]
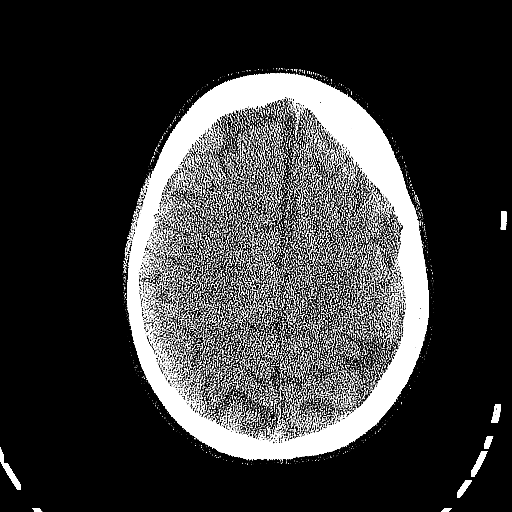
[im 63/79  brain]
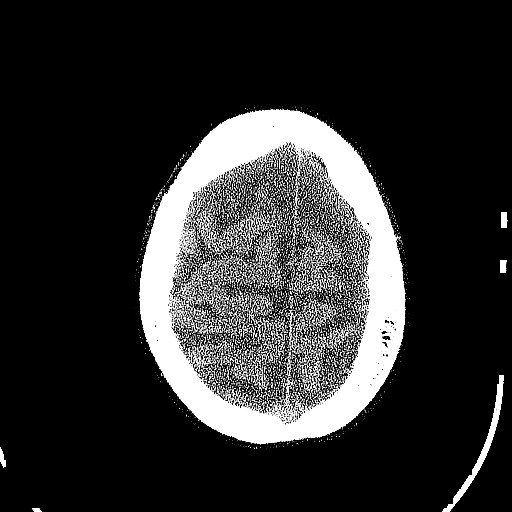
[im 71/79  brain]
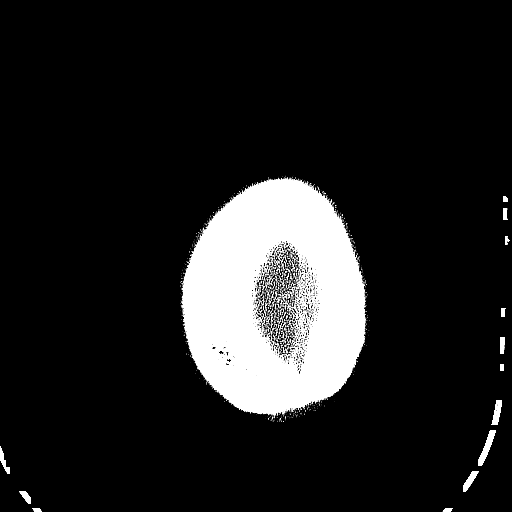

[Series 5: head 3.0 mpr cor · coronal · 0.33mm/px · 3 of 67 slices shown]
[im 23/67  brain]
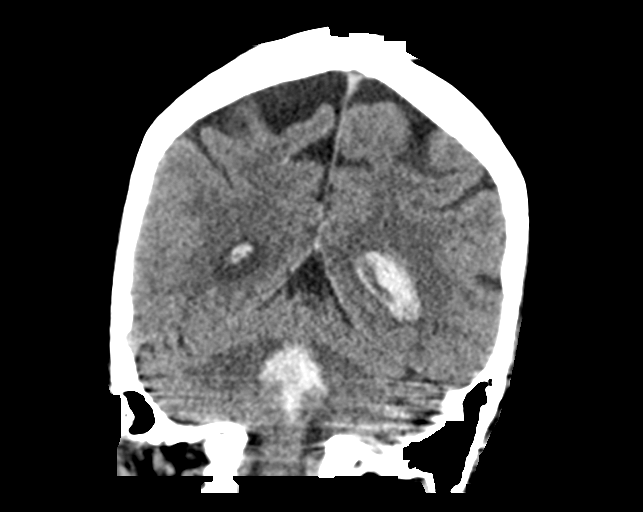
[im 30/67  brain]
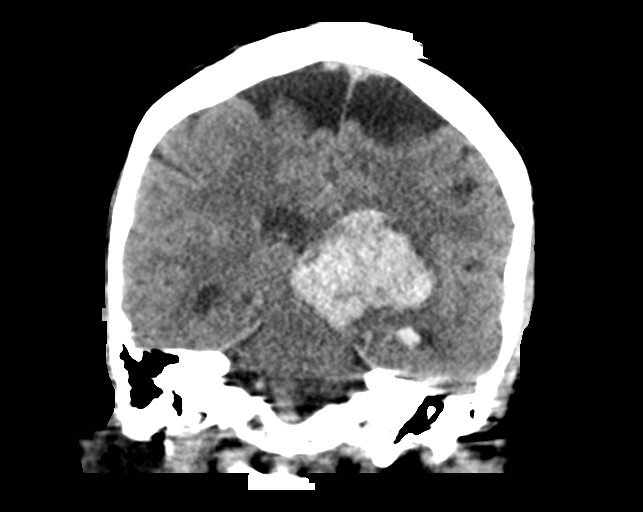
[im 37/67  brain]
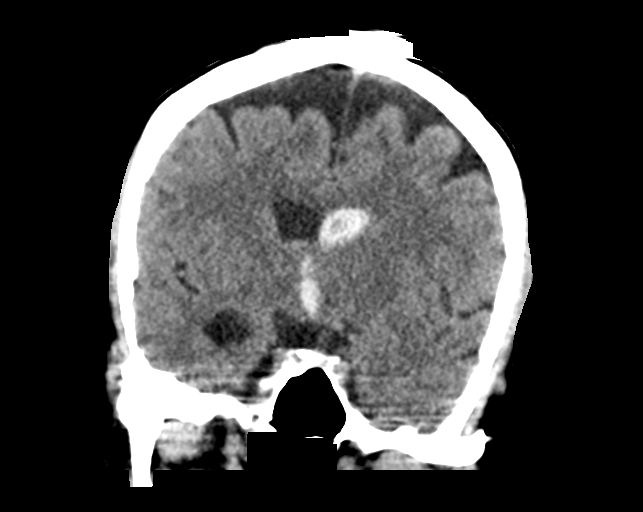

[Series 6: head 3.0 mpr sag · sagittal · 0.37mm/px · 3 of 67 slices shown]
[im 23/67  brain]
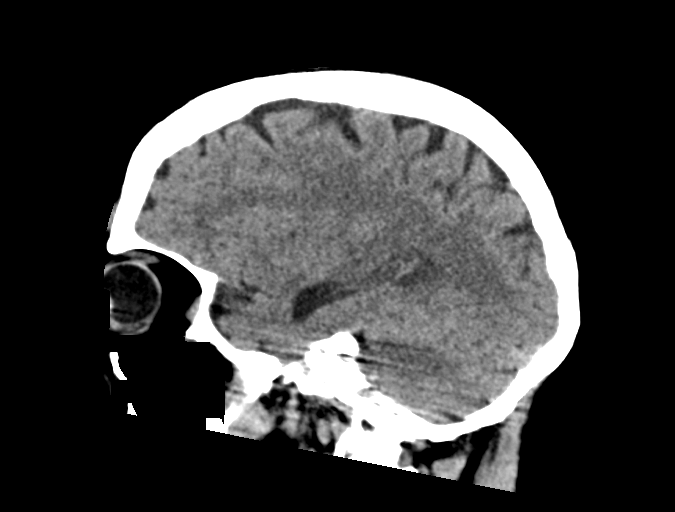
[im 34/67  brain]
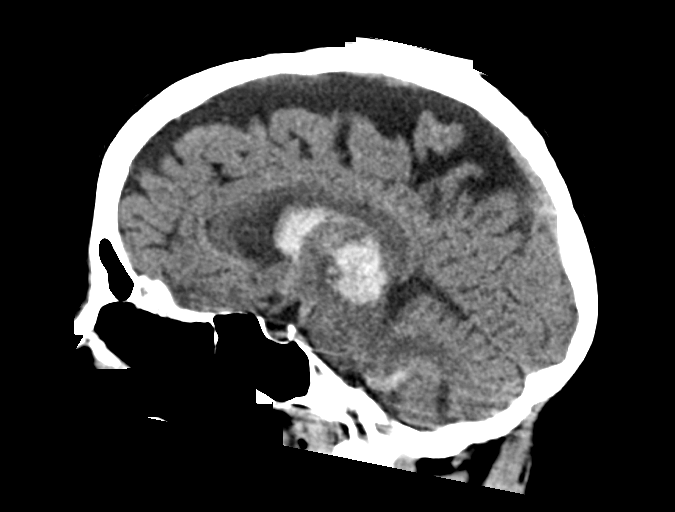
[im 45/67  brain]
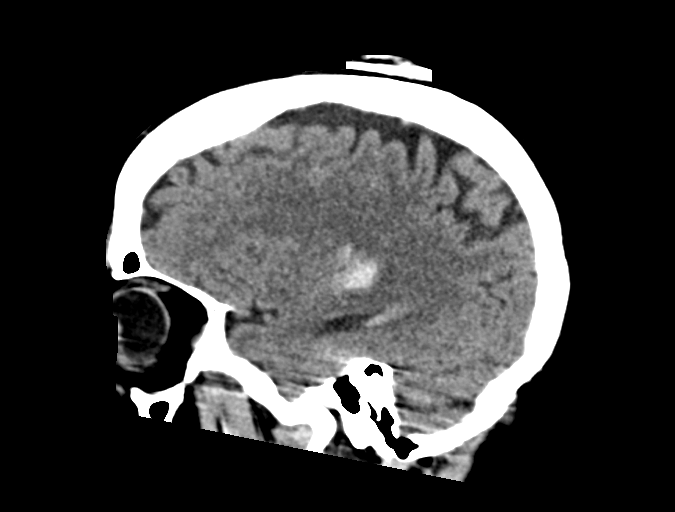

[14 of 47 positions shown; findings below may reference images not displayed]

FINDINGS: Brain: Acute hemorrhage is focused in the left thalamus and basal
ganglia measuring 3.5 x 4.6 x 3.5 cm. The estimated volume is 28
cubic cm intraventricular extension is noted with the lead extending
into the left greater than right lateral ventricle, third ventral,
and the fourth ventricle. There is dilation of the temporal horns
which may reflect early hydrocephalus bilaterally. Surrounding edema
is present. Midline shift extends 8 mm from the midline.

Vascular: No significant vascular calcifications are present.

Skull: Calvarium is intact. No focal lytic or blastic lesions are
present.

Sinuses/Orbits: The paranasal sinuses and mastoid air cells are
clear.
IMPRESSION: 1. Left alignment in basal ganglia hemorrhage with extension into
the ventricles.
2. Hemorrhage volume is estimated at 28 cc.
3. Diffuse intra-articular extension with evidence for early
hydrocephalus.
4. 8 mm of left-to-right midline shift.
These results were called by telephone at the time of interpretation
on 11/02/2016 at [DATE] to Dr. ROSALINA, who verbally acknowledged
these results.

## 2018-04-18 IMAGING — CT CT HEAD W/O CM
3 series · 14 of 47 positions shown, 16 images · non-contrast
Comparison: 11/02/2016 CT head

CLINICAL DATA: 60 y/o  F; follow-up of intracranial hemorrhage.

EXAM:
CT HEAD WITHOUT CONTRAST
TECHNIQUE: Contiguous axial images were obtained from the base of the skull
through the vertex without intravenous contrast.

[Series 3: head 5.0 h30s · axial · 0.46mm/px · z∈[-137,-2]mm · 8 of 33 slices shown, 10 images]
[im 3/33  brain]
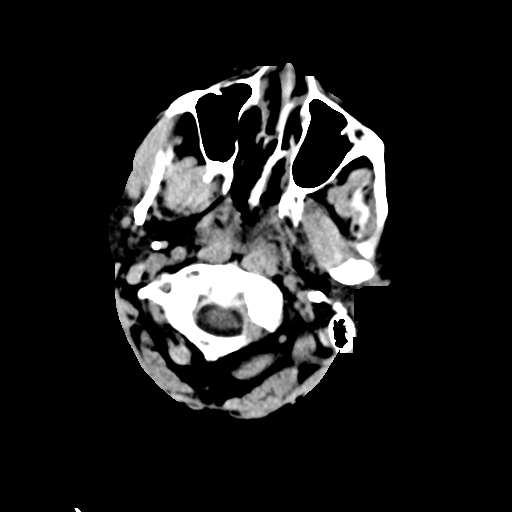
[im 3/33  bone]
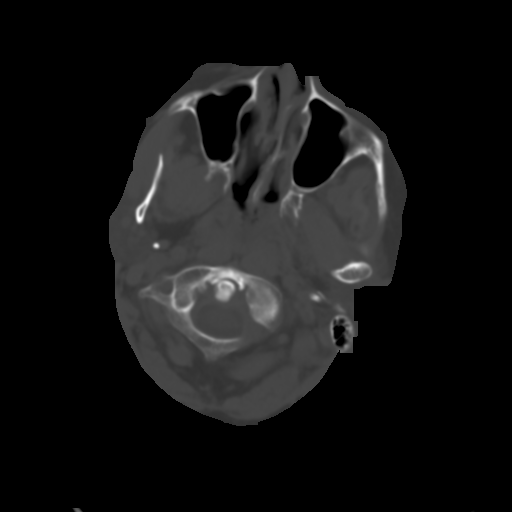
[im 7/33  brain]
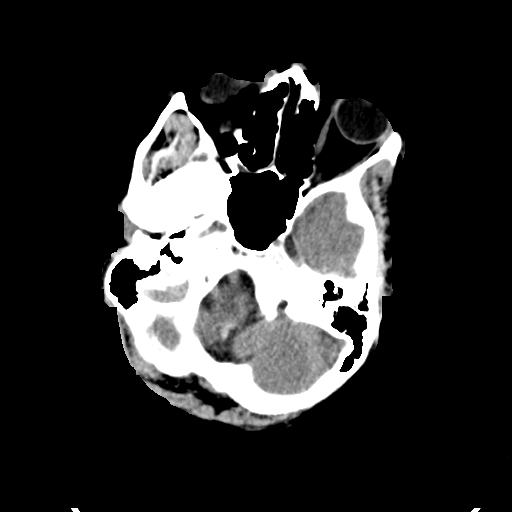
[im 10/33  brain]
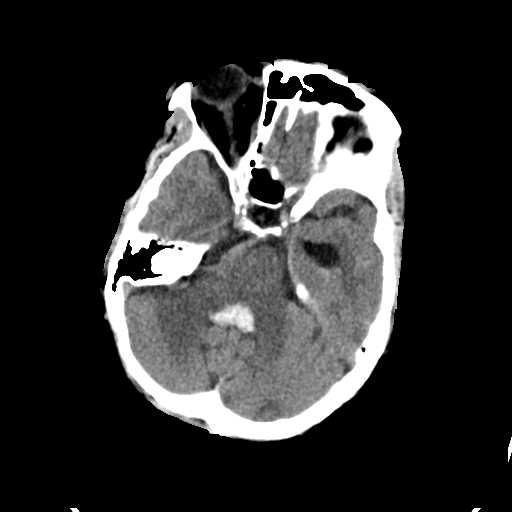
[im 15/33  brain]
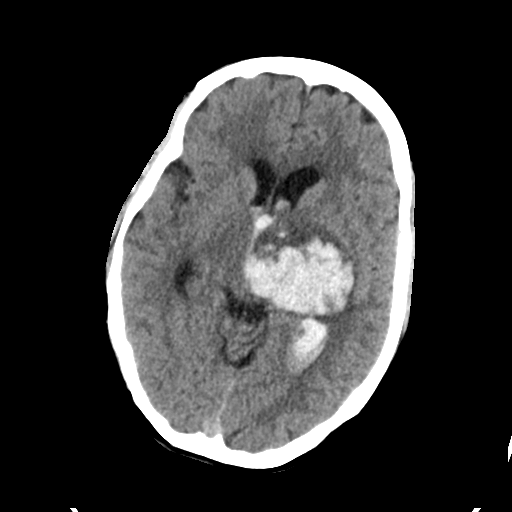
[im 18/33  brain]
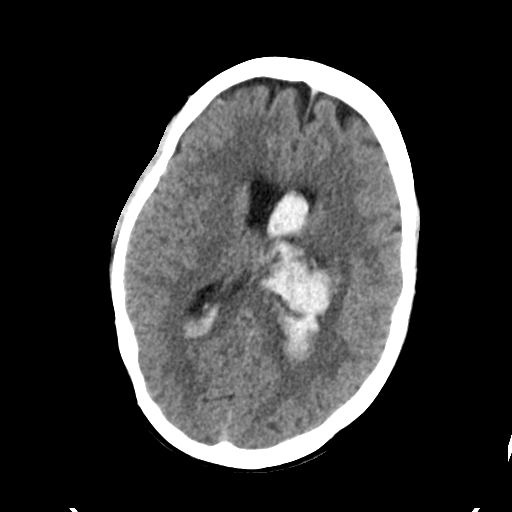
[im 18/33  bone]
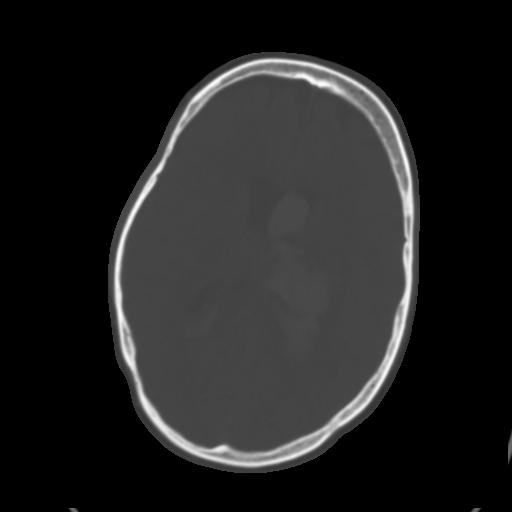
[im 23/33  brain]
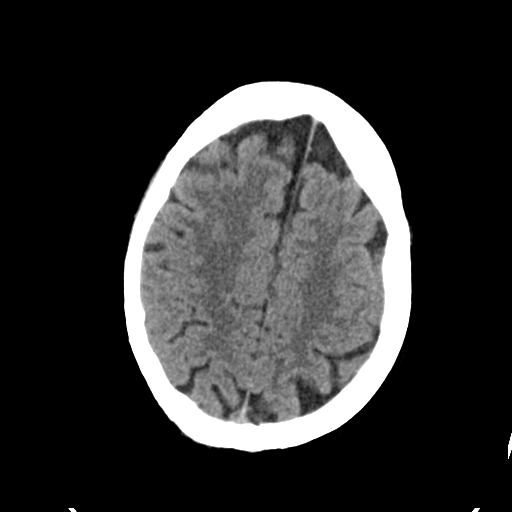
[im 26/33  brain]
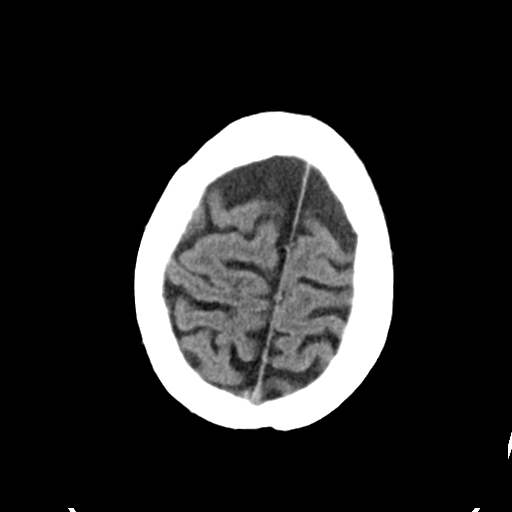
[im 30/33  brain]
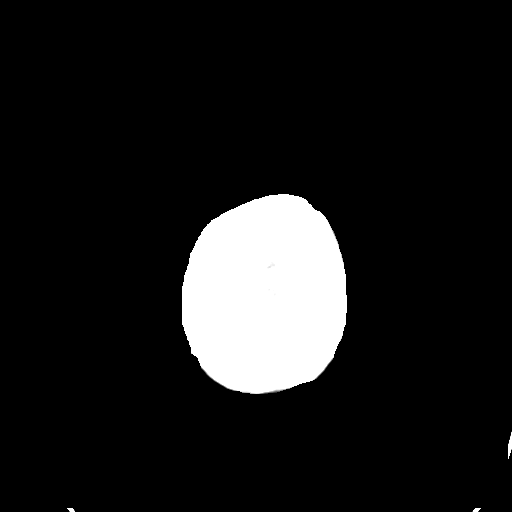

[Series 5: head 3.0 mpr cor · coronal · 0.32mm/px · 3 of 65 slices shown]
[im 22/65  brain]
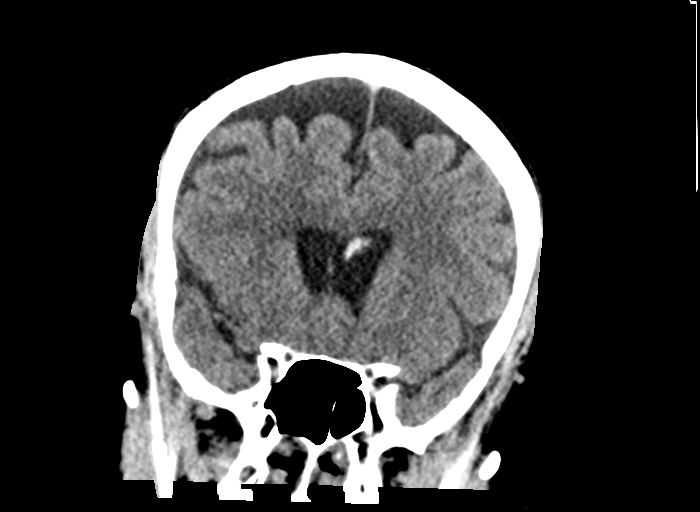
[im 29/65  brain]
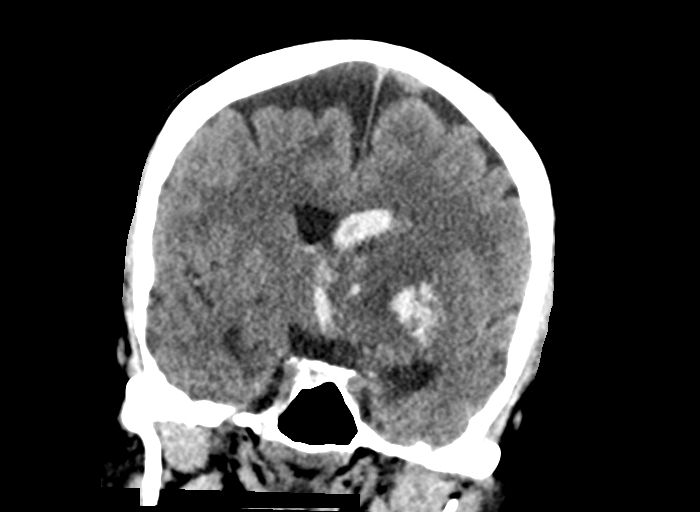
[im 36/65  brain]
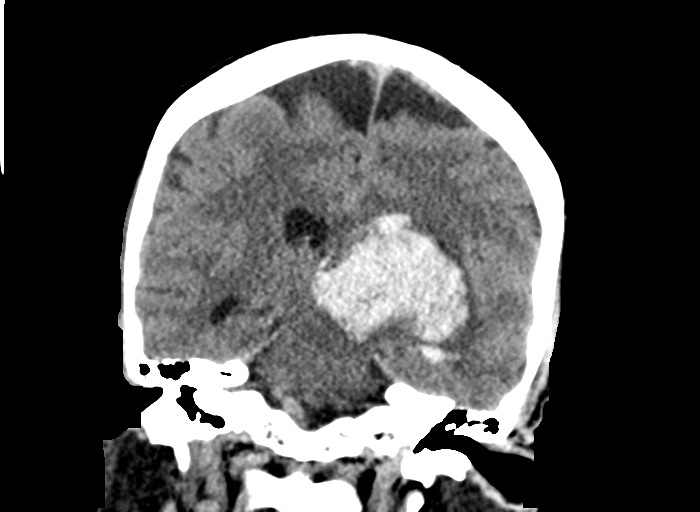

[Series 6: head 3.0 mpr sag · sagittal · 0.32mm/px · 3 of 49 slices shown]
[im 17/49  brain]
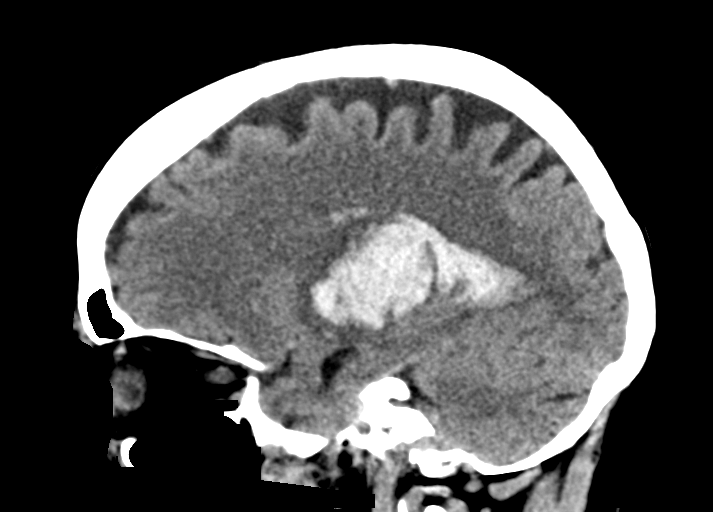
[im 25/49  brain]
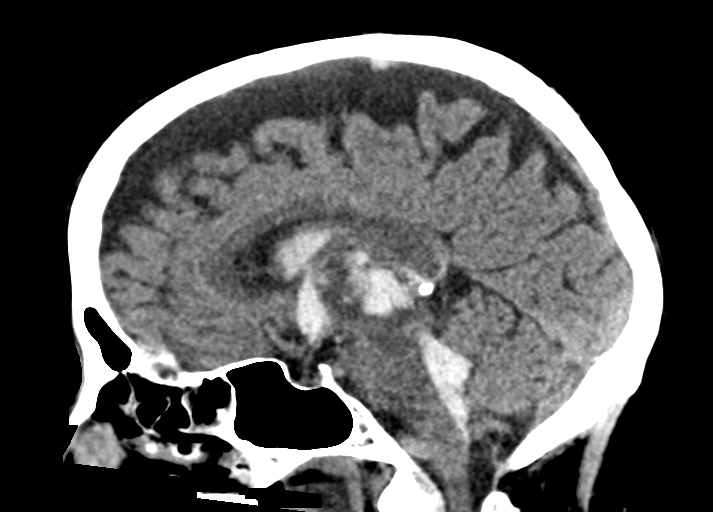
[im 33/49  brain]
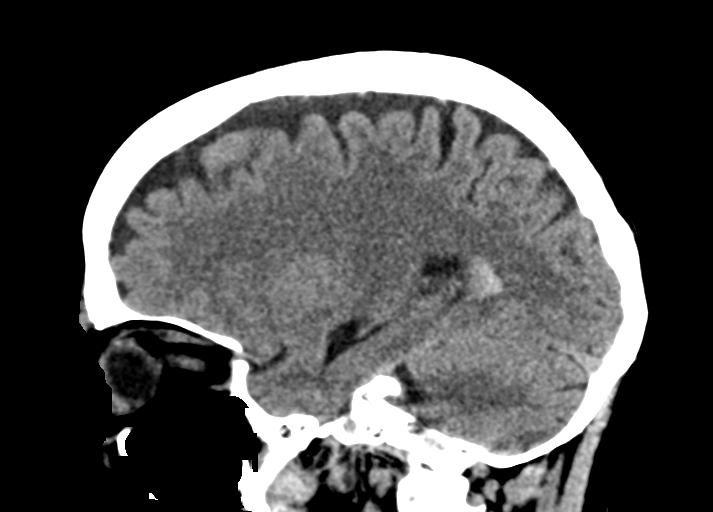

[14 of 47 positions shown; findings below may reference images not displayed]

FINDINGS: Brain: Stable size of acute hemorrhage within the left basal ganglia
measuring 3.8 x 5.1 x 4.1 cm (volume = 42 cm^3) AP x ML x CC
series 3, image 15 and series 5, image 35). Stable intraventricular
hemorrhage greatest within left lateral is fourth ventricles. Stable
mild dilatation of the ventricles. Stable left-to-right midline
shift of 8 mm. No downward herniation. No evidence for new acute
intracranial hemorrhage or infarction.

Vascular: No hyperdense vessel or unexpected calcification.

Skull: Normal. Negative for fracture or focal lesion.

Sinuses/Orbits: Mild ethmoid, sphenoid, and left maxillary sinus
mucosal thickening. Normal aeration of the mastoid air cells. Orbits
are unremarkable.

Other: None.
IMPRESSION: 1. Stable acute hemorrhage (42 cc) within the left basal ganglia and
intraventricular hemorrhage.
2. Stable mild dilatation of ventricles and left-to-right midline
shift of 8 mm. No downward herniation.
3. No new intracranial hemorrhage or infarct identified.

By: Miltin Ladines M.D.
